# Patient Record
Sex: Male | Born: 1985 | Race: White | Hispanic: No | Smoking: Current every day smoker
Health system: Southern US, Community
[De-identification: ages and names within clinical notes are randomized; demographics above are authoritative.]

## PROBLEM LIST (undated history)

## (undated) DIAGNOSIS — S060XAA Concussion with loss of consciousness status unknown, initial encounter: Secondary | ICD-10-CM

## (undated) DIAGNOSIS — Z87442 Personal history of urinary calculi: Secondary | ICD-10-CM

## (undated) DIAGNOSIS — S060X9A Concussion with loss of consciousness of unspecified duration, initial encounter: Secondary | ICD-10-CM

## (undated) DIAGNOSIS — F319 Bipolar disorder, unspecified: Secondary | ICD-10-CM

## (undated) DIAGNOSIS — T8859XA Other complications of anesthesia, initial encounter: Secondary | ICD-10-CM

## (undated) DIAGNOSIS — F419 Anxiety disorder, unspecified: Secondary | ICD-10-CM

## (undated) DIAGNOSIS — T4145XA Adverse effect of unspecified anesthetic, initial encounter: Secondary | ICD-10-CM

## (undated) DIAGNOSIS — F32A Depression, unspecified: Secondary | ICD-10-CM

## (undated) DIAGNOSIS — J45909 Unspecified asthma, uncomplicated: Secondary | ICD-10-CM

## (undated) DIAGNOSIS — F329 Major depressive disorder, single episode, unspecified: Secondary | ICD-10-CM

## (undated) HISTORY — PX: FOOT SURGERY: SHX648

## (undated) HISTORY — PX: CYSTOSCOPY W/ STONE MANIPULATION: SHX1427

## (undated) HISTORY — PX: CHOLECYSTECTOMY: SHX55

## (undated) HISTORY — PX: LITHOTRIPSY: SUR834

---
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
## 2004-07-14 ENCOUNTER — Emergency Department (HOSPITAL_COMMUNITY): Admission: EM | Admit: 2004-07-14 | Discharge: 2004-07-14 | Payer: Self-pay | Admitting: Family Medicine

## 2005-01-10 ENCOUNTER — Emergency Department (HOSPITAL_COMMUNITY): Admission: EM | Admit: 2005-01-10 | Discharge: 2005-01-10 | Payer: Self-pay | Admitting: Family Medicine

## 2005-09-05 ENCOUNTER — Emergency Department (HOSPITAL_COMMUNITY): Admission: EM | Admit: 2005-09-05 | Discharge: 2005-09-05 | Payer: Self-pay | Admitting: Emergency Medicine

## 2005-09-06 ENCOUNTER — Encounter: Admission: RE | Admit: 2005-09-06 | Discharge: 2005-09-06 | Payer: Self-pay | Admitting: Orthopaedic Surgery

## 2005-09-14 ENCOUNTER — Ambulatory Visit (HOSPITAL_COMMUNITY): Admission: RE | Admit: 2005-09-14 | Discharge: 2005-09-15 | Payer: Self-pay | Admitting: Orthopaedic Surgery

## 2005-11-28 ENCOUNTER — Inpatient Hospital Stay (HOSPITAL_COMMUNITY): Admission: EM | Admit: 2005-11-28 | Discharge: 2005-11-30 | Payer: Self-pay

## 2006-03-07 ENCOUNTER — Emergency Department (HOSPITAL_COMMUNITY): Admission: EM | Admit: 2006-03-07 | Discharge: 2006-03-07 | Payer: Self-pay | Admitting: Emergency Medicine

## 2007-07-17 ENCOUNTER — Emergency Department (HOSPITAL_COMMUNITY): Admission: EM | Admit: 2007-07-17 | Discharge: 2007-07-18 | Payer: Self-pay | Admitting: Emergency Medicine

## 2007-07-17 ENCOUNTER — Emergency Department (HOSPITAL_COMMUNITY): Admission: EM | Admit: 2007-07-17 | Discharge: 2007-07-17 | Payer: Self-pay | Admitting: Emergency Medicine

## 2007-07-18 ENCOUNTER — Inpatient Hospital Stay (HOSPITAL_COMMUNITY): Admission: EM | Admit: 2007-07-18 | Discharge: 2007-07-21 | Payer: Self-pay | Admitting: *Deleted

## 2007-07-21 ENCOUNTER — Ambulatory Visit: Payer: Self-pay | Admitting: *Deleted

## 2007-09-11 ENCOUNTER — Emergency Department (HOSPITAL_COMMUNITY): Admission: EM | Admit: 2007-09-11 | Discharge: 2007-09-11 | Payer: Self-pay | Admitting: Family Medicine

## 2007-09-13 ENCOUNTER — Emergency Department (HOSPITAL_COMMUNITY): Admission: EM | Admit: 2007-09-13 | Discharge: 2007-09-13 | Payer: Self-pay | Admitting: Emergency Medicine

## 2007-11-19 ENCOUNTER — Ambulatory Visit: Payer: Self-pay | Admitting: *Deleted

## 2007-11-19 ENCOUNTER — Emergency Department (HOSPITAL_COMMUNITY): Admission: EM | Admit: 2007-11-19 | Discharge: 2007-11-20 | Payer: Self-pay | Admitting: Emergency Medicine

## 2007-11-20 ENCOUNTER — Inpatient Hospital Stay (HOSPITAL_COMMUNITY): Admission: AD | Admit: 2007-11-20 | Discharge: 2007-11-20 | Payer: Self-pay | Admitting: *Deleted

## 2008-07-11 ENCOUNTER — Emergency Department (HOSPITAL_COMMUNITY): Admission: EM | Admit: 2008-07-11 | Discharge: 2008-07-11 | Payer: Self-pay | Admitting: Emergency Medicine

## 2008-08-04 ENCOUNTER — Emergency Department: Payer: Self-pay | Admitting: Emergency Medicine

## 2009-03-25 ENCOUNTER — Emergency Department: Payer: Self-pay | Admitting: Unknown Physician Specialty

## 2009-03-28 ENCOUNTER — Emergency Department: Payer: Self-pay | Admitting: Internal Medicine

## 2009-04-13 ENCOUNTER — Emergency Department: Payer: Self-pay | Admitting: Emergency Medicine

## 2009-07-05 ENCOUNTER — Emergency Department: Payer: Self-pay | Admitting: Emergency Medicine

## 2010-05-11 LAB — RAPID URINE DRUG SCREEN, HOSP PERFORMED
Amphetamines: NOT DETECTED
Barbiturates: NOT DETECTED
Benzodiazepines: NOT DETECTED
Cocaine: NOT DETECTED
Opiates: POSITIVE — AB
Tetrahydrocannabinol: POSITIVE — AB

## 2010-05-11 LAB — POCT I-STAT, CHEM 8
BUN: 5 mg/dL — ABNORMAL LOW (ref 6–23)
Calcium, Ion: 1.18 mmol/L (ref 1.12–1.32)
Chloride: 100 mEq/L (ref 96–112)
Creatinine, Ser: 1 mg/dL (ref 0.4–1.5)
Glucose, Bld: 102 mg/dL — ABNORMAL HIGH (ref 70–99)
HCT: 47 % (ref 39.0–52.0)
Hemoglobin: 16 g/dL (ref 13.0–17.0)
Potassium: 4.1 mEq/L (ref 3.5–5.1)
Sodium: 141 mEq/L (ref 135–145)
TCO2: 28 mmol/L (ref 0–100)

## 2010-05-11 LAB — ETHANOL: Alcohol, Ethyl (B): <5 mg/dL (ref 0–10)
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
## 2010-06-16 NOTE — Discharge Summary (Signed)
[name], Donovan NO.:  [id number]   MEDICAL RECORD NO.:  [id number]         PATIENT TYPE:  BIPS   LOCATION:                                [facility]ACILITY[facility]  [facility]   PHYSICIAN:  Jasmine Pang, M.D. DATE OF BIRTH:  1985-07-23   DATE OF ADMISSION:  11/19/2007  DATE OF DISCHARGE:  11/20/2007                               DISCHARGE SUMMARY   PSYCHIATRIC ADMISSION ASSESSMENT/DISCHARGE SUMMARY.   IDENTIFYING INFORMATION:  This is a 25 year old single white male who  was admitted on a voluntary basis on November 19, 2007.   HISTORY OF PRESENT ILLNESS:  The patient states he started to relapse,  but I got a grip on it.  He states before got too bad.  He states he  was using 2 to 3 bags of heroin a week.  He initially wanted to come  into the hospital to get away from his negative environment and to work  on rehab.  However, now his family has come from [place] to take  him to another program in [place], [place].  He wants to return  home with them because he feels that will be better to be in a different  setting rather than around all his drug-using friends.  Stressors  include financial and job (he still on [employer] from his job at [employer]).   PAST PSYCHIATRIC HISTORY:  The patient was here in June 2009 for heroin  dependence.  He has not seen anyone outpatient recently, but he is  scheduled to see Dr. Evelene Croon on December 14, 2007.   SOCIAL HISTORY:  The patient was for [employer].  He is currently  on FMLA.  He went to [employer] for 1-1/2 years.  He has a girlfriend.  He has  never been married, but hopes to marry this person.   FAMILY HISTORY:  Positive depression in a lot of family members.  Also,  positive alcohol dependence in family members.   ALCOHOL AND DRUG HISTORY:  One-pack cigarettes per day.  He does not use  alcohol.  There is some marijuana use.  Positive heroin abuse as  indicated above.  No cocaine.   MEDICAL PROBLEMS:  None.   MEDICATIONS:  None.   DRUG ALLERGIES:  No known drug allergies.   PHYSICAL FINDINGS:  There were no acute physical or medical problems  noted.  The patient's physical exam was done in the ED.   Admission laboratories were done in the ED and evaluated by the ED  physician.   HOSPITAL COURSE:  Upon admission, the patient's mental status was  friendly and cooperative.  He was dressed casually.  His speech was  normal rate and flow.  Psychomotor activity was within normal limits.  He had good eye contact.  Mood was depressed and anxious.  Affect  consistent with mood.  There was no suicidal or homicidal ideation.  No  thoughts of self-injurious behavior.  No auditory or visual  hallucinations.  No paranoia or delusions.  Thoughts were logical and  goal-directed, thought content.  No predominant theme.  Cognitive was  intact grossly.  Insight good, judgment good, impulse control good.  The  patient wanted to leave the hospital.  He stated he had only been using  2 to 3 bags per week.  He did not feel like he needed to be on a detox  protocol.  His family had driven from [place] this morning to take  him back home.  There were going to enter him in a program at the  [facility] in [place], [place].  It was felt the patient  was safe to go with his family and was able to be discharged today.   DISCHARGE DIAGNOSES:  Axis I:  Polysubstance dependence.  Axis II:  None.  Axis III:  Healthy.  Axis IV:  Severe (problems with primary support group, occupational  problem, economic problem, other psychosocial problem).  Axis V:  Global assessment of functioning was 55 upon discharge.  GAF  was 45 upon admission.  GAF highest past year was 65-70.   DISCHARGE PLAN:  There was no specific activity level or dietary  restrictions.  The patient will go to rehab program in [place], [place] today.  His family is taking him there now.  He will also see  Dr. Evelene Croon, for  psychiatric followup on December 14, 2007, at 12:45 p.m.   DISCHARGE MEDICATIONS:  None.      Jasmine Pang, M.D.  Electronically Signed     BHS/MEDQ  D:  11/20/2007  T:  11/21/2007  Job:  [id number]
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
## 2010-10-29 LAB — BASIC METABOLIC PANEL
BUN: 9
CO2: 27
Calcium: 9.5
Creatinine, Ser: 0.83
GFR calc Af Amer: >60

## 2010-10-29 LAB — URINE MICROSCOPIC-ADD ON

## 2010-10-29 LAB — URINALYSIS, ROUTINE W REFLEX MICROSCOPIC
Glucose, UA: NEGATIVE
Hgb urine dipstick: NEGATIVE
Leukocytes, UA: NEGATIVE
Nitrite: NEGATIVE
Protein, ur: 30 — AB
Specific Gravity, Urine: >1.046 — ABNORMAL HIGH
Urobilinogen, UA: 1
pH: 6

## 2010-10-29 LAB — DIFFERENTIAL
Basophils Absolute: 0
Basophils Relative: 0
Eosinophils Absolute: 0
Monocytes Relative: 6
Neutro Abs: 7.9 — ABNORMAL HIGH
Neutrophils Relative %: 75

## 2010-10-29 LAB — CBC
MCHC: 34.8
Platelets: 249
RBC: 5.37
WBC: 10.5

## 2010-10-29 LAB — RAPID URINE DRUG SCREEN, HOSP PERFORMED
Amphetamines: NOT DETECTED
Benzodiazepines: POSITIVE — AB
Cocaine: NOT DETECTED

## 2010-10-29 LAB — ACETAMINOPHEN LEVEL: Acetaminophen (Tylenol), Serum: 15.2

## 2010-10-29 LAB — SALICYLATE LEVEL: Salicylate Lvl: <4

## 2010-10-29 LAB — ETHANOL: Alcohol, Ethyl (B): <5

## 2010-11-02 LAB — CBC
HCT: 43.3
Hemoglobin: 14.6
MCHC: 33.7
MCV: 85.8
RBC: 5.05
WBC: 8.8

## 2010-11-02 LAB — DIFFERENTIAL
Basophils Relative: 0
Eosinophils Absolute: 0.2
Lymphs Abs: 2.3
Monocytes Absolute: 0.5
Monocytes Relative: 6
Neutrophils Relative %: 66

## 2010-11-02 LAB — RAPID URINE DRUG SCREEN, HOSP PERFORMED
Barbiturates: NOT DETECTED
Benzodiazepines: NOT DETECTED

## 2010-11-02 LAB — BASIC METABOLIC PANEL
CO2: 27
Chloride: 103
GFR calc Af Amer: >60
Potassium: 4
Sodium: 137

## 2010-11-02 LAB — ETHANOL: Alcohol, Ethyl (B): <5

## 2014-01-26 ENCOUNTER — Encounter (HOSPITAL_COMMUNITY): Payer: Self-pay | Admitting: Family Medicine

## 2014-01-26 ENCOUNTER — Emergency Department (HOSPITAL_COMMUNITY): Payer: BC Managed Care – PPO

## 2014-01-26 ENCOUNTER — Emergency Department (HOSPITAL_COMMUNITY)
Admission: EM | Admit: 2014-01-26 | Discharge: 2014-01-26 | Disposition: A | Discharge status: Home / Self Care | Payer: BC Managed Care – PPO | Attending: Emergency Medicine | Admitting: Emergency Medicine

## 2014-01-26 DIAGNOSIS — M545 Low back pain: Secondary | ICD-10-CM | POA: Diagnosis present

## 2014-01-26 DIAGNOSIS — Z720 Tobacco use: Secondary | ICD-10-CM | POA: Insufficient documentation

## 2014-01-26 DIAGNOSIS — R109 Unspecified abdominal pain: Secondary | ICD-10-CM | POA: Diagnosis not present

## 2014-01-26 DIAGNOSIS — Z9049 Acquired absence of other specified parts of digestive tract: Secondary | ICD-10-CM | POA: Diagnosis not present

## 2014-01-26 DIAGNOSIS — Z79899 Other long term (current) drug therapy: Secondary | ICD-10-CM | POA: Insufficient documentation

## 2014-01-26 DIAGNOSIS — Z791 Long term (current) use of non-steroidal anti-inflammatories (NSAID): Secondary | ICD-10-CM | POA: Diagnosis not present

## 2014-01-26 LAB — URINALYSIS, ROUTINE W REFLEX MICROSCOPIC
Bilirubin Urine: NEGATIVE
Glucose, UA: NEGATIVE mg/dL
Hgb urine dipstick: NEGATIVE
KETONES UR: NEGATIVE mg/dL
LEUKOCYTES UA: NEGATIVE
NITRITE: NEGATIVE
PH: 7 (ref 5.0–8.0)
Protein, ur: NEGATIVE mg/dL
SPECIFIC GRAVITY, URINE: 1.015 (ref 1.005–1.030)
Urobilinogen, UA: 0.2 mg/dL (ref 0.0–1.0)

## 2014-01-26 LAB — CBC WITH DIFFERENTIAL/PLATELET
BASOS ABS: 0 10*3/uL (ref 0.0–0.1)
BASOS PCT: 1 % (ref 0–1)
Eosinophils Absolute: 0.1 10*3/uL (ref 0.0–0.7)
Eosinophils Relative: 1 % (ref 0–5)
HCT: 39.6 % (ref 39.0–52.0)
HEMOGLOBIN: 13.7 g/dL (ref 13.0–17.0)
Lymphocytes Relative: 26 % (ref 12–46)
Lymphs Abs: 1.9 10*3/uL (ref 0.7–4.0)
MCH: 29.7 pg (ref 26.0–34.0)
MCHC: 34.6 g/dL (ref 30.0–36.0)
MCV: 85.7 fL (ref 78.0–100.0)
MONOS PCT: 6 % (ref 3–12)
Monocytes Absolute: 0.4 10*3/uL (ref 0.1–1.0)
NEUTROS ABS: 4.8 10*3/uL (ref 1.7–7.7)
NEUTROS PCT: 66 % (ref 43–77)
PLATELETS: 192 10*3/uL (ref 150–400)
RBC: 4.62 MIL/uL (ref 4.22–5.81)
RDW: 12.9 % (ref 11.5–15.5)
WBC: 7.2 10*3/uL (ref 4.0–10.5)

## 2014-01-26 LAB — COMPREHENSIVE METABOLIC PANEL
ALBUMIN: 3.9 g/dL (ref 3.5–5.2)
ALK PHOS: 67 U/L (ref 39–117)
ALT: 17 U/L (ref 0–53)
AST: 15 U/L (ref 0–37)
Anion gap: 10 (ref 5–15)
BILIRUBIN TOTAL: 0.1 mg/dL — AB (ref 0.3–1.2)
BUN: 12 mg/dL (ref 6–23)
CHLORIDE: 106 meq/L (ref 96–112)
CO2: 23 mmol/L (ref 19–32)
Calcium: 9.1 mg/dL (ref 8.4–10.5)
Creatinine, Ser: 0.79 mg/dL (ref 0.50–1.35)
GFR calc Af Amer: >90 mL/min (ref >90)
GFR calc non Af Amer: >90 mL/min (ref >90)
Glucose, Bld: 100 mg/dL — ABNORMAL HIGH (ref 70–99)
POTASSIUM: 4.2 mmol/L (ref 3.5–5.1)
SODIUM: 139 mmol/L (ref 135–145)
Total Protein: 6.9 g/dL (ref 6.0–8.3)

## 2014-01-26 LAB — LIPASE, BLOOD: Lipase: 32 U/L (ref 11–59)

## 2014-01-26 LAB — LACTIC ACID, PLASMA: Lactic Acid, Venous: 0.9 mmol/L (ref 0.5–2.2)

## 2014-01-26 MED ORDER — OXYCODONE-ACETAMINOPHEN 7.5-325 MG PO TABS: 1.0000 | ORAL_TABLET | ORAL | Status: DC | PRN

## 2014-01-26 MED ORDER — ONDANSETRON HCL 4 MG/2ML IJ SOLN
4.0000 mg | Freq: Once | INTRAMUSCULAR | Status: AC
  Administered 2014-01-26: 4 mg via INTRAVENOUS
  Filled 2014-01-26: qty 2

## 2014-01-26 MED ORDER — KETOROLAC TROMETHAMINE 30 MG/ML IJ SOLN
15.0000 mg | Freq: Once | INTRAMUSCULAR | Status: AC
  Administered 2014-01-26: 15 mg via INTRAVENOUS
  Filled 2014-01-26: qty 1

## 2014-01-26 MED ORDER — OXYCODONE-ACETAMINOPHEN 5-325 MG PO TABS
1.0000 | ORAL_TABLET | Freq: Once | ORAL | Status: AC
  Administered 2014-01-26: 1 via ORAL
  Filled 2014-01-26: qty 1

## 2014-01-26 MED ORDER — CYCLOBENZAPRINE HCL 10 MG PO TABS: 10.0000 mg | ORAL_TABLET | Freq: Two times a day (BID) | ORAL | Status: DC | PRN

## 2014-01-26 MED ORDER — SODIUM CHLORIDE 0.9 % IV SOLN
Freq: Once | INTRAVENOUS | Status: AC
  Administered 2014-01-26: 18:00:00 via INTRAVENOUS

## 2014-01-26 NOTE — ED Notes (Signed)
Pt to CT at this time.

## 2014-01-26 NOTE — Discharge Instructions (Signed)
Back Pain, Adult °Back pain is very common. The pain often gets better over time. The cause of back pain is usually not dangerous. Most people can learn to manage their back pain on their own.  °HOME CARE  °· Stay active. Start with short walks on flat ground if you can. Try to walk farther each day. °· Do not sit, drive, or stand in one place for more than 30 minutes. Do not stay in bed. °· Do not avoid exercise or work. Activity can help your back heal faster. °· Be careful when you bend or lift an object. Bend at your knees, keep the object close to you, and do not twist. °· Sleep on a firm mattress. Lie on your side, and bend your knees. If you lie on your back, put a pillow under your knees. °· Only take medicines as told by your doctor. °· Put ice on the injured area. °¨ Put ice in a plastic bag. °¨ Place a towel between your skin and the bag. °¨ Leave the ice on for 15-20 minutes, 03-04 times a day for the first 2 to 3 days. After that, you can switch between ice and heat packs. °· Ask your doctor about back exercises or massage. °· Avoid feeling anxious or stressed. Find good ways to deal with stress, such as exercise. °GET HELP RIGHT AWAY IF:  °· Your pain does not go away with rest or medicine. °· Your pain does not go away in 1 week. °· You have new problems. °· You do not feel well. °· The pain spreads into your legs. °· You cannot control when you poop (bowel movement) or pee (urinate). °· Your arms or legs feel weak or lose feeling (numbness). °· You feel sick to your stomach (nauseous) or throw up (vomit). °· You have belly (abdominal) pain. °· You feel like you may pass out (faint). °MAKE SURE YOU:  °· Understand these instructions. °· Will watch your condition. °· Will get help right away if you are not doing well or get worse. °Document Released: 07/07/2007 Document Revised: 04/12/2011 Document Reviewed: 05/22/2013 °Exit[employer]® Patient Information ©2015 [employer]. This information is not intended  to replace advice given to you by your health care provider. Make sure you discuss any questions you have with your health care provider. ° °

## 2014-01-26 NOTE — ED Notes (Signed)
Pt c/o pain in left lower back radiating into left testicle off and on for several weeks.  St's pain has been constant for past 3 days.  Pt st's slight relief of pain with Percocet given to triage.

## 2014-01-26 NOTE — ED Notes (Signed)
Pt here with lower left back pain and pain radiating into testicle. sts testicle is not swollen. sts feels heaviness. sts hx of kidney stone.

## 2014-01-26 NOTE — ED Provider Notes (Signed)
CSN: [id number]     Arrival date & time 01/26/14  1314 History   First MD Initiated Contact with Patient 01/26/14 1630     Chief Complaint  Patient presents with  . Back Pain  . Testicle Pain      HPI Pt c/o pain in left lower back radiating into left testicle off and on for several weeks. St's pain has been constant for past 3 days. Pt st's slight relief of pain with Percocet given to triage.   History reviewed. No pertinent past medical history. Past Surgical History  Procedure Laterality Date  . Lithotripsy    . Cholecystectomy     History reviewed. No pertinent family history. History  Substance Use Topics  . Smoking status: Current Every Day Smoker  . Smokeless tobacco: Not on file  . Alcohol Use: No    Review of Systems All other systems reviewed and are negative   Allergies  Review of patient's allergies indicates no known allergies.  Home Medications   Prior to Admission medications   Medication Sig Start Date End Date Taking? Authorizing Provider  cyclobenzaprine (FLEXERIL) 10 MG tablet Take 1 tablet (10 mg total) by mouth 2 (two) times daily as needed for muscle spasms. 01/26/14   Nelia Shiobert L Shanaya Schneck, MD  naproxen sodium (ANAPROX) 220 MG tablet Take 880 mg by mouth 2 (two) times daily with a meal.   Yes Historical Provider, MD  nicotine (NICODERM CQ - DOSED IN MG/24 HOURS) 21 mg/24hr patch Place 21 mg onto the skin daily.   Yes Historical Provider, MD  oxyCODONE-acetaminophen (PERCOCET) 7.5-325 MG per tablet Take 1 tablet by mouth every 4 (four) hours as needed for pain. 01/26/14   Nelia Shiobert L Evelyn Moch, MD  PARoxetine (PAXIL) 20 MG tablet Take 20 mg by mouth daily.   Yes Historical Provider, MD   BP 123/82 mmHg  Pulse 88  Temp(Src) 98.2 F (36.8 C)  Resp 18  SpO2 98% Physical Exam Physical Exam  Nursing note and vitals reviewed. Constitutional: He is oriented to person, place, and time. He appears well-developed and well-nourished. No distress.  HENT:   Head: Normocephalic and atraumatic.  Eyes: Pupils are equal, round, and reactive to light.  Neck: Normal range of motion.  Cardiovascular: Normal rate and intact distal pulses.   Pulmonary/Chest: No respiratory distress.  Abdominal: Normal appearance. He exhibits no distension.  Musculoskeletal: Pain to palpation left paralumbar sacral area.  Some testicular pain left testicle noted.  No definite hernia in the left inguinal area.  No evidence of rash or zoster.   Neurological: He is alert and oriented to person, place, and time. No cranial nerve deficit.  Skin: Skin is warm and dry. No rash noted.  Psychiatric: He has a normal mood and affect. His behavior is normal.   ED Course  Procedures (including critical care time)  Medications  oxyCODONE-acetaminophen (PERCOCET/ROXICET) 5-325 MG per tablet 1 tablet (1 tablet Oral Given 01/26/14 1422)  ketorolac (TORADOL) 30 MG/ML injection 15 mg (15 mg Intravenous Given 01/26/14 1747)  ondansetron (ZOFRAN) injection 4 mg (4 mg Intravenous Given 01/26/14 1750)  0.9 %  sodium chloride infusion ( Intravenous Stopped 01/26/14 2019)    Labs Review Labs Reviewed  COMPREHENSIVE METABOLIC PANEL - Abnormal; Notable for the following:    Glucose, Bld 100 (*)    Total Bilirubin 0.1 (*)    All other components within normal limits  URINALYSIS, ROUTINE W REFLEX MICROSCOPIC  CBC WITH DIFFERENTIAL  LACTIC ACID, PLASMA  LIPASE, BLOOD  Imaging Review [place] Scrotum  01/26/2014   CLINICAL DATA:  28 year old male with pain radiating to the left testicle for several days. No known injury. Initial encounter.  EXAM: SCROTAL ULTRASOUND  DOPPLER ULTRASOUND OF THE TESTICLES  TECHNIQUE: Complete ultrasound examination of the testicles, epididymis, and other scrotal structures was performed. Color and spectral Doppler ultrasound were also utilized to evaluate blood flow to the testicles.  COMPARISON:  Non contrast CT Abdomen and Pelvis 1649 hr the same day, and  earlier.  FINDINGS: Right testicle  Measurements: 4.0 x 2.0 x 4.6 cm. No mass or microlithiasis visualized.  Left testicle  Measurements: 3.1 x 2.1 x 2.8 cm. No mass or microlithiasis visualized.  Right epididymis:  Normal in size and appearance.  Left epididymis:  Normal in size and appearance.  Hydrocele:  None visualized.  Varicocele: Asymmetric venous prominence on the left, but not to the extent compatible with varicocele (veins up to 2-3 mm diameter).  Pulsed Doppler interrogation of both testes demonstrates low resistance arterial and venous waveforms bilaterally. No convincing asymmetric hypervascularity.  IMPRESSION: Negative for testicular torsion, no definite acute inflammation or acute finding identified.   Electronically Signed   By: Augusto GambleLee  Hall M.D.   On: 01/26/2014 19:42   [place] Art/ven Flow Abd Pelv Doppler  01/26/2014   CLINICAL DATA:  28 year old male with pain radiating to the left testicle for several days. No known injury. Initial encounter.  EXAM: SCROTAL ULTRASOUND  DOPPLER ULTRASOUND OF THE TESTICLES  TECHNIQUE: Complete ultrasound examination of the testicles, epididymis, and other scrotal structures was performed. Color and spectral Doppler ultrasound were also utilized to evaluate blood flow to the testicles.  COMPARISON:  Non contrast CT Abdomen and Pelvis 1649 hr the same day, and earlier.  FINDINGS: Right testicle  Measurements: 4.0 x 2.0 x 4.6 cm. No mass or microlithiasis visualized.  Left testicle  Measurements: 3.1 x 2.1 x 2.8 cm. No mass or microlithiasis visualized.  Right epididymis:  Normal in size and appearance.  Left epididymis:  Normal in size and appearance.  Hydrocele:  None visualized.  Varicocele: Asymmetric venous prominence on the left, but not to the extent compatible with varicocele (veins up to 2-3 mm diameter).  Pulsed Doppler interrogation of both testes demonstrates low resistance arterial and venous waveforms bilaterally. No convincing asymmetric  hypervascularity.  IMPRESSION: Negative for testicular torsion, no definite acute inflammation or acute finding identified.   Electronically Signed   By: Augusto GambleLee  Hall M.D.   On: 01/26/2014 19:42   Ct Renal Stone Study  01/26/2014   CLINICAL DATA:  [age] year old male with left flank pain radiating to the left groin and testicle for 2-3 days. Initial encounter.  EXAM: CT ABDOMEN AND PELVIS WITHOUT CONTRAST  TECHNIQUE: Multidetector CT imaging of th dome e abdomen and pelvis was performed following the standard protocol without IV contrast.  COMPARISON:  CT Abdomen and Pelvis 01/17/2012.  FINDINGS: Minor atelectasis, otherwise negative lung bases. No pericardial or pleural effusion.  No osseous abnormality identified.  No pelvic free fluid. Decompressed, negative distal colon. Left colon, transverse colon, right colon and appendix. No dilated small bowel. Negative non contrast stomach, duodenum, liver, spleen, pancreas, and adrenal glands. Gallbladder now surgically absent. No abdominal free fluid.  Negative non contrast right kidney, and ureter.  Mild asymmetric enlargement of the left renal collecting system compared to the right, but this appearance is unchanged from prior studies. No left intra renal calculus. No definite left hydroureter No calculus or obstructing lesion identified along  the course of the left ureter. Left ureterovesical junction appears normal. Unremarkable bladder. Small posterior left pelvis phlebolith is unchanged. No lymphadenopathy identified.  IMPRESSION: 1. Mild asymmetric enlargement of the left renal collecting system is stable since 2013, and no urologic calculus or obstructive in the etiology is identified. 2. Otherwise negative non contrast CT Abdomen and Pelvis.   Electronically Signed   By: Augusto Gamble M.D.   On: 01/26/2014 17:07   After review of labs in imaging studies.  The most likely source of pain is the lumbar area.  Patient has an appointment with his doctor on Tuesday.  I  recommended he call him on Monday and arrange for an outpatient MRI of the lumbosacral spine.  The meantime he is to take Aleve 2 tablets every 8 hours along with the oxycodone and Flexeril.  Patient agreeable and will follow-up as directed.   MDM   Final diagnoses:  Left flank pain        Nelia Shi, MD 01/26/14 2021

## 2014-01-26 NOTE — ED Notes (Signed)
Pt st's no relief from pain med. 

## 2014-01-29 ENCOUNTER — Other Ambulatory Visit (HOSPITAL_COMMUNITY): Payer: Self-pay | Admitting: Family Medicine

## 2014-01-29 ENCOUNTER — Ambulatory Visit (HOSPITAL_COMMUNITY)
Admission: RE | Admit: 2014-01-29 | Discharge: 2014-01-29 | Disposition: A | Discharge status: Home / Self Care | Payer: BC Managed Care – PPO | Source: Ambulatory Visit | Attending: Family Medicine | Admitting: Family Medicine

## 2014-01-29 DIAGNOSIS — G8929 Other chronic pain: Secondary | ICD-10-CM

## 2014-01-29 DIAGNOSIS — M549 Dorsalgia, unspecified: Secondary | ICD-10-CM | POA: Diagnosis present

## 2014-01-29 DIAGNOSIS — M5126 Other intervertebral disc displacement, lumbar region: Secondary | ICD-10-CM | POA: Diagnosis not present

## 2014-01-29 DIAGNOSIS — M5127 Other intervertebral disc displacement, lumbosacral region: Secondary | ICD-10-CM | POA: Insufficient documentation

## 2014-10-03 ENCOUNTER — Encounter (HOSPITAL_COMMUNITY): Payer: Self-pay | Admitting: *Deleted

## 2014-10-03 ENCOUNTER — Emergency Department (HOSPITAL_COMMUNITY)
Admission: EM | Admit: 2014-10-03 | Discharge: 2014-10-03 | Disposition: A | Discharge status: Home / Self Care | Payer: BLUE CROSS/BLUE SHIELD | Attending: Emergency Medicine | Admitting: Emergency Medicine

## 2014-10-03 DIAGNOSIS — Z23 Encounter for immunization: Secondary | ICD-10-CM | POA: Insufficient documentation

## 2014-10-03 DIAGNOSIS — Z791 Long term (current) use of non-steroidal anti-inflammatories (NSAID): Secondary | ICD-10-CM | POA: Diagnosis not present

## 2014-10-03 DIAGNOSIS — Z79899 Other long term (current) drug therapy: Secondary | ICD-10-CM | POA: Insufficient documentation

## 2014-10-03 DIAGNOSIS — L089 Local infection of the skin and subcutaneous tissue, unspecified: Secondary | ICD-10-CM

## 2014-10-03 DIAGNOSIS — F1310 Sedative, hypnotic or anxiolytic abuse, uncomplicated: Secondary | ICD-10-CM | POA: Diagnosis not present

## 2014-10-03 DIAGNOSIS — Z720 Tobacco use: Secondary | ICD-10-CM | POA: Diagnosis not present

## 2014-10-03 DIAGNOSIS — F1210 Cannabis abuse, uncomplicated: Secondary | ICD-10-CM | POA: Insufficient documentation

## 2014-10-03 DIAGNOSIS — Z008 Encounter for other general examination: Secondary | ICD-10-CM | POA: Diagnosis present

## 2014-10-03 DIAGNOSIS — L0889 Other specified local infections of the skin and subcutaneous tissue: Secondary | ICD-10-CM | POA: Diagnosis not present

## 2014-10-03 DIAGNOSIS — F1110 Opioid abuse, uncomplicated: Secondary | ICD-10-CM

## 2014-10-03 DIAGNOSIS — F1410 Cocaine abuse, uncomplicated: Secondary | ICD-10-CM | POA: Diagnosis not present

## 2014-10-03 LAB — COMPREHENSIVE METABOLIC PANEL
ALT: 22 U/L (ref 17–63)
AST: 26 U/L (ref 15–41)
Albumin: 4.4 g/dL (ref 3.5–5.0)
Alkaline Phosphatase: 77 U/L (ref 38–126)
Anion gap: 8 (ref 5–15)
BUN: 13 mg/dL (ref 6–20)
CHLORIDE: 104 mmol/L (ref 101–111)
CO2: 28 mmol/L (ref 22–32)
Calcium: 9.2 mg/dL (ref 8.9–10.3)
Creatinine, Ser: 0.86 mg/dL (ref 0.61–1.24)
GFR calc Af Amer: >60 mL/min (ref >60)
Glucose, Bld: 121 mg/dL — ABNORMAL HIGH (ref 65–99)
POTASSIUM: 3.5 mmol/L (ref 3.5–5.1)
Sodium: 140 mmol/L (ref 135–145)
Total Bilirubin: 0.6 mg/dL (ref 0.3–1.2)
Total Protein: 7.4 g/dL (ref 6.5–8.1)

## 2014-10-03 LAB — RAPID URINE DRUG SCREEN, HOSP PERFORMED
AMPHETAMINES: NOT DETECTED
BENZODIAZEPINES: POSITIVE — AB
Barbiturates: NOT DETECTED
COCAINE: POSITIVE — AB
Opiates: POSITIVE — AB
TETRAHYDROCANNABINOL: POSITIVE — AB

## 2014-10-03 LAB — CBC
HEMATOCRIT: 41.5 % (ref 39.0–52.0)
HEMOGLOBIN: 14.5 g/dL (ref 13.0–17.0)
MCH: 30.9 pg (ref 26.0–34.0)
MCHC: 34.9 g/dL (ref 30.0–36.0)
MCV: 88.3 fL (ref 78.0–100.0)
Platelets: 212 10*3/uL (ref 150–400)
RBC: 4.7 MIL/uL (ref 4.22–5.81)
RDW: 12.6 % (ref 11.5–15.5)
WBC: 7.9 10*3/uL (ref 4.0–10.5)

## 2014-10-03 LAB — ACETAMINOPHEN LEVEL: Acetaminophen (Tylenol), Serum: <10 ug/mL — ABNORMAL LOW (ref 10–30)

## 2014-10-03 LAB — SALICYLATE LEVEL: Salicylate Lvl: <4 mg/dL (ref 2.8–30.0)

## 2014-10-03 LAB — ETHANOL

## 2014-10-03 MED ORDER — TETANUS-DIPHTH-ACELL PERTUSSIS 5-2.5-18.5 LF-MCG/0.5 IM SUSP
0.5000 mL | Freq: Once | INTRAMUSCULAR | Status: AC
  Administered 2014-10-03: 0.5 mL via INTRAMUSCULAR
  Filled 2014-10-03: qty 0.5

## 2014-10-03 MED ORDER — CEPHALEXIN 500 MG PO CAPS: 500.0000 mg | ORAL_CAPSULE | Freq: Four times a day (QID) | ORAL | Status: DC

## 2014-10-03 NOTE — Discharge Instructions (Signed)
Opioid Withdrawal Opioids are a group of narcotic drugs. They include the street drug heroin. They also include pain medicines, such as morphine, hydrocodone, oxycodone, and fentanyl. Opioid withdrawal is a group of characteristic physical and mental signs and symptoms. It typically occurs if you have been using opioids daily for several weeks or longer and stop using or rapidly decrease use. Opioid withdrawal can also occur if you have used opioids daily for a long time and are given a medicine to block the effect.  SIGNS AND SYMPTOMS Opioid withdrawal includes three or more of the following symptoms:   Depressed, anxious, or irritable mood.  Nausea or vomiting.  Muscle aches or spasms.   Watery eyes.   Runny nose.  Emergency Department Resource Guide 1) Find a Doctor and Pay Out of Pocket Although you won't have to find out who is covered by your insurance plan, it is a good idea to ask around and get recommendations. You will then need to call the office and see if the doctor you have chosen will accept you as a new patient and what types of options they offer for patients who are self-pay. Some doctors offer discounts or will set up payment plans for their patients who do not have insurance, but you will need to ask so you aren't surprised when you get to your appointment.  2) Contact Your Local Health Department Not all health departments have doctors that can see patients for sick visits, but many do, so it is worth a call to see if yours does. If you don't know where your local health department is, you can check in your phone book. The CDC also has a tool to help you locate your state's health department, and many state websites also have listings of all of their local health departments.  3) Find a Walk-in Clinic If your illness is not likely to be very severe or complicated, you may want to try a walk in clinic. These are popping up all over the country in pharmacies, drugstores,  and shopping centers. They're usually staffed by nurse practitioners or physician assistants that have been trained to treat common illnesses and complaints. They're usually fairly quick and inexpensive. However, if you have serious medical issues or chronic medical problems, these are probably not your best option.  No Primary Care Doctor: - Call Health Connect at  [phone] - they can help you locate a primary care doctor that  accepts your insurance, provides certain services, etc. - Physician Referral Service- [phone]  Chronic Pain Problems: Organization         Address  Phone   Notes  [facility]  [phone] Patients need to be referred by their primary care doctor.   Medication Assistance: Organization         Address  Phone   Notes  [facility] Medication [facility] [address] [place] [place], [place] [address] [phone] --Must be a resident of [facility] -- Must have NO insurance coverage whatsoever (no Medicaid/ Medicare, etc.) -- The pt. MUST have a primary care doctor that directs their care regularly and follows them in the community   [facility]  [phone]   [employer]  [phone]    Agencies that provide inexpensive medical care: Organization         Address  Phone   Notes  [facility]  [phone]   [facility]    [phone]  [facility] [address] [place], [place] [address] [phone]   [facility] [address] [place], [place] [phone]   Planned Parenthood    ([phone]   [facility]    [phone]   [facility]  [address], [place] Phone:  [phone], Fax:  [phone] Hours of Operation:  9 am - 6 pm, M-F.  Also accepts Medicaid/Medicare and self-pay.  [facility]  [address], [place] Phone: [phone], Fax: [phone]. Hours of Operation:  8:30 am - 5:30 pm, M-F.  Also accepts Medicaid and self-pay.  [facility] [place] [address] [place] Phone: [phone]   [facility] [address] Natasha Bence [place], [place] [phone], Ext. [phone] Mondays & Thursdays: 7-9 AM.  First 15 patients are seen on a first come, first serve basis.    Medicaid-accepting [facility] Providers:  Organization         Address  Phone   Notes  [facility] [address], [place] [phone] Also accepts self-pay patients.  [facility] [address] Laurell Josephs [place], [place]  [phone]   [facility] [address], [place] [phone]   [facility] [address] [place] [phone]   Renaye Rakers [address], [place]   [phone] Only accepts [place] Access [place] patients after they have their name applied to their card.   Self-Pay (no insurance) in [facility]:  Organization         Address  Phone   Notes  Sickle Cell Patients, [facility] Medicine [address] [place], [place] [phone]   [facility] [address] [place], [place] [phone]   [facility] [place]  [address], [place] [phone]   [facility]/Dr. Osei-Bonsu  [address] [place] or [address], [place] [phone] Phone number for both [place] and [place] locations is the same.  [facility] and [facility] [address], [place] [phone]   [facility] [address] [place] or [address] [phone]   [facility] [address] [place] [phone], phone; [phone], fax Sees patients 1st and 3rd Saturday of every month.  Must not qualify for public or  private insurance (i.e. Medicaid, Medicare, [place] Health Choice, Veterans' Benefits)  Household income should be no more than 200% of the poverty level The clinic cannot treat you if you are pregnant or think you are pregnant  Sexually transmitted diseases are not treated at the clinic.    Dental Care: Organization         Address  Phone  Notes  [facility] Department of [facility] [address] [place], [place] [phone] Accepts children up to age 17 who are enrolled in [place] or [place] Health Choice; pregnant women with a Medicaid card; and children who have applied for Medicaid or  Health Choice, but were declined, whose parents can pay a reduced fee at time of service.  [facility] Department of [facility]  [address], [place] [phone] Accepts children up to age 21 who  are enrolled in Medicaid or [place] Health Choice; pregnant women with a Medicaid card; and children who have applied for Medicaid or [place] Health Choice, but were declined, whose parents can pay a reduced fee at time of service.  [facility]  [address] [place], [place] [phone] Patients are seen by appointment only. Walk-ins are not accepted. [facility] will see patients 39 years of age and older. Monday - Tuesday (8am-5pm) Most Wednesdays (8:30-5pm) $30 per visit, cash only  [facility]  [address], [facility] [phone] Patients are seen by appointment only. Walk-ins are not accepted. [facility] will see patients 64 years of age and older. One Wednesday Evening (Monthly: Volunteer Based).  $30 per visit, cash only  [employer]  [phone] for adults; Children under age 65, call Graduate Pediatric Dentistry at [phone]. Children aged 50-14, please call [phone] to request a pediatric application.  Dental services are provided in all areas of dental  care including fillings, crowns and bridges, complete and partial dentures, implants, gum treatment, root canals, and extractions. Preventive care is also provided. Treatment is provided to both adults and children. Patients are selected via a lottery and there is often a waiting list.   [facility] [address], [place]  [phone] [email or web address]   [facility] [address] [place], [place] [phone], Ext. [phone] Second and Fourth Thursday of each month, opens at 6:30 AM; Clinic ends at 9 AM.  Patients are seen on a first-come first-served basis, and a limited number are seen during each clinic.   [facility]  [address] Ether Griffins [place], [place] [phone]   Eligibility Requirements You must have lived in [place], [place], or [place] counties for at least the last three months.   You cannot be eligible for state or federal sponsored [place], including [employer], [place], or [employer].   You generally cannot be eligible for healthcare insurance through your employer.    How to apply: Eligibility screenings are held every Tuesday and Wednesday afternoon from 1:00 pm until 4:00 pm. You do not need an appointment for the interview!  [facility] [address] [place], [place] [phone]   [facility] Health Department  [phone]   [facility] Health Department  [phone]   [facility] Health Department  [phone]    Behavioral Health Resources in the Community: Intensive Outpatient Programs Organization         Address  Phone  Notes  [facility] Services [address], [place], [place] [phone]   [facility] [address] [place], [place] [phone]   [facility] [address] [place], [place]  [phone]   [facility] Mental Health [address],  [place], [place] [phone] or [phone]    Substance Abuse Resources Organization         Address  Phone  Notes  [facility]  [phone]   [facility]  [phone]   [facility]  [phone]   Floydene Flock  [phone]   Residential & Outpatient Substance Abuse Program  [phone]   Psychological Services Organization         Address  Phone  Notes  [facility] Behavioral Health  [phone]   [facility]  [phone]   [facility] Mental Health [address],  [place] [phone] or [phone]    Mobile Crisis Teams Organization         Address  Phone  Notes  [facility], Mobile Crisis Care Unit  [phone]   [facility]  [address] [place], [place] [phone]   Doristine Locks [address] [place] [phone]    Self-Help/Support Groups Organization         Address  Phone             Notes  [facility] [place] - variety of support groups  [phone]- [phone] Call for more information  Narcotics Anonymous (NA), [facility] [address], [employer] [place]  2 meetings at this location   Statistician         Address  Phone  Notes  [facility] [address],    [place]  [phone]   [facility]  [address] [place] [id number], [place], [place] [phone]   [facility] [address] [place], [place] [phone] Admissions: 8am-3pm M-F  [facility] [address],    [place], [place] [phone]   [facility] [address] [place], [place], [place] [phone]   [facility] [address],  [place], [place] [phone]   [facility] - Intensive Outpatient [address] Laurell Josephs [address], [place], [place] [phone]   [facility] [address] [place].,  [place], [place] [phone] or [phone]   [facility]  Services (RT[facility]) [address] [place], [place] [phone] Accepts Medicaid  [facility] [address],  [place] [phone] Substance Abuse/Addiction Treatment   [facility] Organization         Address  Phone  Notes  [facility]  [phone]   Angie Fava, PhD [address] Ervin Knack [place], [place]   [phone] or [phone]   [facility] Behavioral   [address] [place], [place] [phone]   [facility] [address] [place], [place] [phone] Insurance/Medicaid/sponsorship through [facility] and Families [address]                                    [place], [place] [phone] Therapy/tele-psych/case  [facility] [address], [place] [phone]    Dr. Lolly Mustache  [phone]   [facility] [place]  [employer] [facility] Dept. 1) [address], [place] 2) [address], [place] 3)  [address], [place] [phone]  [phone]   [facility] Child Abuse Hotline (619)131-9255 or [phone] (After Hours)        Dilated pupils, sweating, or hairs standing on end.  Diarrhea or intestinal cramping.  Yawning.   Fever.  Increased blood pressure.  Fast pulse.  Restlessness or trouble sleeping. These signs and symptoms occur within several hours of stopping or reducing short-acting opioids, such as heroin. They can occur within 3 days of stopping or reducing long-acting opioids, such as methadone. Withdrawal begins within minutes of receiving a drug that blocks the effects of opioids, such as naltrexone or naloxone. DIAGNOSIS  Opioid use disorder is diagnosed by your health care provider. You will be asked about your symptoms, drug and alcohol use, medical history,  and use of medicines. A physical exam may be done. Lab tests may be ordered. Your health care provider may have you see a  mental health professional.  TREATMENT  The treatment for opioid withdrawal is usually provided by medical doctors with special training in substance use disorders (addiction specialists). The following medicines may be included in treatment:  Opioids given in place of the abused opioid. They turn on opioid receptors in the brain and lessen or prevent withdrawal symptoms. They are gradually decreased (opioid substitution and taper).  Non-opioids that can lessen certain opioid withdrawal symptoms. They may be used alone or with opioid substitution and taper. Successful long-term recovery usually requires medicine, counseling, and group support. HOME CARE INSTRUCTIONS   Take medicines only as directed by your health care provider.  Check with your health care provider before starting new medicines.  Keep all follow-up visits as directed by your health care provider. SEEK MEDICAL CARE IF:  You are not able to take your medicines as directed.  Your symptoms get worse.  You relapse. SEEK IMMEDIATE MEDICAL CARE IF:  You have serious thoughts about hurting yourself or others.  You have a seizure.  You lose consciousness. Document Released: 01/21/2003 Document Revised: 06/04/2013 Document Reviewed: 01/31/2013 [facility] Patient Information 2015 [place], [place]. This information is not intended to replace advice given to you by your health care provider. Make sure you discuss any questions you have with your health care provider.

## 2014-10-03 NOTE — ED Notes (Addendum)
Pt here for medical clearance and opioid detox. Pt states his psychiatrist wanted to make sure he was medically clear before starting him on medication for bipolar depression, OCD and PTSD. 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 Pt states he told his wife 2 months ago and stopped taking percocet for one week before starting back. Pt denies SI/HI

## 2014-10-03 NOTE — ED Provider Notes (Signed)
History  This chart was scribed for non-physician practitioner, Teressa Lower, FNP,working with Alvira Monday, MD, by Karle Plumber, ED Scribe. This patient was seen in room WTR4/WLPT4 and the patient's care was started at 7:14 PM.  Chief Complaint  Patient presents with  . Medical Clearance   The history is provided by the patient and medical records. No language interpreter was used.    HPI Comments:  [name] is a 29 y.o. male who presents to the Emergency Department complaining of needing a medical clearance for opioid detox. He states his psychiatrist, Dr. Yetta Barre, wanted him to be medically cleared before starting detox. Pt states he has contacted several different places for help. 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 He states he became addicted about one year ago when he fractured three vertebrates in his back. He reports he is a recovering heroin addict and used for about three months 6 years ago and was not honest with the neurologist when he hurt his back and that is why he was able to get the Percocet to begin with. He states he stopped taking the Percocet for about 2 months ago for a week but started back. He states he takes 3-5 Percocet 10 mg daily which is down from 8 daily before stopping himself two months ago. Pt reports several sores on the RUE due to picking the areas because of the Percocet. He denies SI, HI, fever, chills, nausea or vomiting. He is unsure of his last tetanus vaccination.  History reviewed. No pertinent past medical history. Past Surgical History  Procedure Laterality Date  . Lithotripsy    . Cholecystectomy     No family history on file. Social History  Substance Use Topics  . Smoking status: Current Every Day Smoker  . Smokeless tobacco: None  . Alcohol Use: No    Review of Systems  Constitutional: Negative for fever and chills.  Gastrointestinal: Negative for nausea and vomiting.  Skin:  Positive for wound.  Psychiatric/Behavioral: Negative for suicidal ideas, behavioral problems and self-injury.  All other systems reviewed and are negative.   Allergies  Review of patient's allergies indicates no known allergies.  Home Medications   Prior to Admission medications   Medication Sig Start Date End Date Taking? Authorizing Provider  cyclobenzaprine (FLEXERIL) 10 MG tablet Take 1 tablet (10 mg total) by mouth 2 (two) times daily as needed for muscle spasms. 01/26/14   Nelva Nay, MD  naproxen sodium (ANAPROX) 220 MG tablet Take 880 mg by mouth 2 (two) times daily with a meal.    Historical Provider, MD  nicotine (NICODERM CQ - DOSED IN MG/24 HOURS) 21 mg/24hr patch Place 21 mg onto the skin daily.    Historical Provider, MD  oxyCODONE-acetaminophen (PERCOCET) 7.5-325 MG per tablet Take 1 tablet by mouth every 4 (four) hours as needed for pain. 01/26/14   Nelva Nay, MD  PARoxetine (PAXIL) 20 MG tablet Take 20 mg by mouth daily.    Historical Provider, MD   Triage Vitals: BP 143/87 mmHg  Pulse 117  Temp(Src) 98.5 F (36.9 C) (Oral)  Resp 20  SpO2 96% Physical Exam  Constitutional: He is oriented to person, place, and time. He appears well-developed and well-nourished.  HENT:  Head: Normocephalic and atraumatic.  Eyes: EOM are normal.  Neck: Normal range of motion.  Cardiovascular: Normal rate.   Pulmonary/Chest: Effort normal and breath sounds normal.  Abdominal: Soft. Bowel sounds are normal. There is no  tenderness.  Musculoskeletal: Normal range of motion.  Neurological: He is alert and oriented to person, place, and time. He exhibits normal muscle tone. Coordination normal.  Skin:  Multiple sores to bilateral forearms  Psychiatric: He has a normal mood and affect. His behavior is normal. He expresses no homicidal and no suicidal ideation.  Nursing note and vitals reviewed.   ED Course  Procedures (including critical care time) DIAGNOSTIC STUDIES: Oxygen  Saturation is 96% on RA, adequate by my interpretation.   COORDINATION OF CARE: 7:23 PM- Will order medical clearance labs and prescribe antibiotics for the arm wounds. Informed pt that we do not provide inpatient opioid detox services and would provide him with outpatient resources. Pt verbalizes understanding and agrees to plan.  Medications  Tdap (BOOSTRIX) injection 0.5 mL (not administered)    Labs Review Labs Reviewed  CBC  COMPREHENSIVE METABOLIC PANEL  ETHANOL  URINE RAPID DRUG SCREEN, HOSP PERFORMED  ACETAMINOPHEN LEVEL  SALICYLATE LEVEL    MDM   Final diagnoses:  Skin infection  Opiate abuse, continuous    Discussed with pt that we don't detox opiates   I personally performed the services described in this documentation, which was scribed in my presence. The recorded information has been reviewed and is accurate.    Teressa Lower, NP 10/03/14 2003  Alvira Monday, MD 10/04/14 1335

## 2014-10-03 NOTE — ED Notes (Signed)
Delay in lab draw, NP in with pt

## 2015-03-25 ENCOUNTER — Encounter: Payer: Self-pay | Admitting: Emergency Medicine

## 2015-03-25 ENCOUNTER — Inpatient Hospital Stay
Admission: EM | Admit: 2015-03-25 | Discharge: 2015-03-31 | DRG: 897 | Disposition: A | Discharge status: Home / Self Care | Payer: No Typology Code available for payment source | Source: Intra-hospital | Attending: Psychiatry | Admitting: Psychiatry

## 2015-03-25 ENCOUNTER — Emergency Department
Admission: EM | Admit: 2015-03-25 | Discharge: 2015-03-25 | Disposition: A | Discharge status: Other Acute Inpatient Hospital | Payer: BLUE CROSS/BLUE SHIELD | Attending: Emergency Medicine | Admitting: Emergency Medicine

## 2015-03-25 DIAGNOSIS — F10239 Alcohol dependence with withdrawal, unspecified: Secondary | ICD-10-CM | POA: Diagnosis present

## 2015-03-25 DIAGNOSIS — F3289 Other specified depressive episodes: Secondary | ICD-10-CM

## 2015-03-25 DIAGNOSIS — G8929 Other chronic pain: Secondary | ICD-10-CM | POA: Diagnosis present

## 2015-03-25 DIAGNOSIS — F32A Depression, unspecified: Secondary | ICD-10-CM

## 2015-03-25 DIAGNOSIS — Z818 Family history of other mental and behavioral disorders: Secondary | ICD-10-CM

## 2015-03-25 DIAGNOSIS — F17210 Nicotine dependence, cigarettes, uncomplicated: Secondary | ICD-10-CM | POA: Diagnosis present

## 2015-03-25 DIAGNOSIS — F17200 Nicotine dependence, unspecified, uncomplicated: Secondary | ICD-10-CM

## 2015-03-25 DIAGNOSIS — F1124 Opioid dependence with opioid-induced mood disorder: Secondary | ICD-10-CM | POA: Diagnosis present

## 2015-03-25 DIAGNOSIS — Z9049 Acquired absence of other specified parts of digestive tract: Secondary | ICD-10-CM | POA: Diagnosis not present

## 2015-03-25 DIAGNOSIS — F1290 Cannabis use, unspecified, uncomplicated: Secondary | ICD-10-CM

## 2015-03-25 DIAGNOSIS — T402X2A Poisoning by other opioids, intentional self-harm, initial encounter: Secondary | ICD-10-CM | POA: Insufficient documentation

## 2015-03-25 DIAGNOSIS — Y998 Other external cause status: Secondary | ICD-10-CM | POA: Insufficient documentation

## 2015-03-25 DIAGNOSIS — F1490 Cocaine use, unspecified, uncomplicated: Secondary | ICD-10-CM

## 2015-03-25 DIAGNOSIS — G4700 Insomnia, unspecified: Secondary | ICD-10-CM | POA: Diagnosis present

## 2015-03-25 DIAGNOSIS — F1123 Opioid dependence with withdrawal: Secondary | ICD-10-CM | POA: Diagnosis present

## 2015-03-25 DIAGNOSIS — M549 Dorsalgia, unspecified: Secondary | ICD-10-CM | POA: Diagnosis present

## 2015-03-25 DIAGNOSIS — T43592A Poisoning by other antipsychotics and neuroleptics, intentional self-harm, initial encounter: Secondary | ICD-10-CM | POA: Insufficient documentation

## 2015-03-25 DIAGNOSIS — F1210 Cannabis abuse, uncomplicated: Secondary | ICD-10-CM | POA: Insufficient documentation

## 2015-03-25 DIAGNOSIS — F1120 Opioid dependence, uncomplicated: Secondary | ICD-10-CM

## 2015-03-25 DIAGNOSIS — F1190 Opioid use, unspecified, uncomplicated: Secondary | ICD-10-CM

## 2015-03-25 DIAGNOSIS — F1410 Cocaine abuse, uncomplicated: Secondary | ICD-10-CM | POA: Insufficient documentation

## 2015-03-25 DIAGNOSIS — T6592XA Toxic effect of unspecified substance, intentional self-harm, initial encounter: Secondary | ICD-10-CM

## 2015-03-25 DIAGNOSIS — R45851 Suicidal ideations: Secondary | ICD-10-CM

## 2015-03-25 DIAGNOSIS — Y9389 Activity, other specified: Secondary | ICD-10-CM | POA: Insufficient documentation

## 2015-03-25 DIAGNOSIS — Z9889 Other specified postprocedural states: Secondary | ICD-10-CM | POA: Diagnosis not present

## 2015-03-25 DIAGNOSIS — F329 Major depressive disorder, single episode, unspecified: Secondary | ICD-10-CM | POA: Insufficient documentation

## 2015-03-25 DIAGNOSIS — Y9289 Other specified places as the place of occurrence of the external cause: Secondary | ICD-10-CM | POA: Insufficient documentation

## 2015-03-25 DIAGNOSIS — F1020 Alcohol dependence, uncomplicated: Secondary | ICD-10-CM

## 2015-03-25 DIAGNOSIS — Z630 Problems in relationship with spouse or partner: Secondary | ICD-10-CM | POA: Diagnosis not present

## 2015-03-25 HISTORY — DX: Depression, unspecified: F32.A

## 2015-03-25 HISTORY — DX: Bipolar disorder, unspecified: F31.9

## 2015-03-25 HISTORY — DX: Anxiety disorder, unspecified: F41.9

## 2015-03-25 HISTORY — DX: Major depressive disorder, single episode, unspecified: F32.9

## 2015-03-25 LAB — URINE DRUG SCREEN, QUALITATIVE (ARMC ONLY)
AMPHETAMINES, UR SCREEN: NOT DETECTED
Barbiturates, Ur Screen: NOT DETECTED
Benzodiazepine, Ur Scrn: NOT DETECTED
COCAINE METABOLITE, UR ~~LOC~~: POSITIVE — AB
Cannabinoid 50 Ng, Ur ~~LOC~~: POSITIVE — AB
MDMA (Ecstasy)Ur Screen: NOT DETECTED
Methadone Scn, Ur: NOT DETECTED
OPIATE, UR SCREEN: POSITIVE — AB
PHENCYCLIDINE (PCP) UR S: NOT DETECTED
Tricyclic, Ur Screen: NOT DETECTED

## 2015-03-25 LAB — COMPREHENSIVE METABOLIC PANEL
ALBUMIN: 4.1 g/dL (ref 3.5–5.0)
ALT: 14 U/L — ABNORMAL LOW (ref 17–63)
ANION GAP: 6 (ref 5–15)
AST: 21 U/L (ref 15–41)
Alkaline Phosphatase: 62 U/L (ref 38–126)
BILIRUBIN TOTAL: 0.4 mg/dL (ref 0.3–1.2)
BUN: 20 mg/dL (ref 6–20)
CALCIUM: 8.8 mg/dL — AB (ref 8.9–10.3)
CO2: 28 mmol/L (ref 22–32)
Chloride: 107 mmol/L (ref 101–111)
Creatinine, Ser: 0.93 mg/dL (ref 0.61–1.24)
GFR calc Af Amer: >60 mL/min (ref >60)
GFR calc non Af Amer: >60 mL/min (ref >60)
GLUCOSE: 104 mg/dL — AB (ref 65–99)
Potassium: 3.6 mmol/L (ref 3.5–5.1)
SODIUM: 141 mmol/L (ref 135–145)
Total Protein: 7.1 g/dL (ref 6.5–8.1)

## 2015-03-25 LAB — CBC
HEMATOCRIT: 40.5 % (ref 40.0–52.0)
HEMOGLOBIN: 13.9 g/dL (ref 13.0–18.0)
MCH: 30.3 pg (ref 26.0–34.0)
MCHC: 34.2 g/dL (ref 32.0–36.0)
MCV: 88.7 fL (ref 80.0–100.0)
Platelets: 230 10*3/uL (ref 150–440)
RBC: 4.57 MIL/uL (ref 4.40–5.90)
RDW: 13.3 % (ref 11.5–14.5)
WBC: 8.4 10*3/uL (ref 3.8–10.6)

## 2015-03-25 LAB — SALICYLATE LEVEL: Salicylate Lvl: <4 mg/dL (ref 2.8–30.0)

## 2015-03-25 LAB — ETHANOL: Alcohol, Ethyl (B): <5 mg/dL (ref <5)

## 2015-03-25 LAB — ACETAMINOPHEN LEVEL

## 2015-03-25 NOTE — ED Notes (Signed)
Patient states he attempted suicide twice this weekend and has an addiction problem with alcohol and prescription pain medications.  Denies current SI/ HI

## 2015-03-25 NOTE — ED Provider Notes (Signed)
[facility] Emergency Department Provider Note  ____________________________________________  Time seen: Approximately 11:35 PM  I have reviewed the triage vital signs and the nursing notes.   HISTORY  Chief Complaint Suicidal    HPI KEYMARION BEARMAN is a 30 y.o. male and a history of depression and polysubstance abuse presenting with suicidal ideations and suicide attempt 2. Patient states that he has had some recent stressors with his pregnant girlfriend and her son. On Friday he took multiple tablets of 30 mg oxycodone and Seroquel with a fifth of vodka, and then he repeated all 3 of these on Saturday night in an attempt to kill himself. He denies any homicidal ideations or hallucinations. He denies any medical complaints including abdominal pain, nausea vomiting or diarrhea, fever, headache.   History reviewed. No pertinent past medical history.  There are no active problems to display for this patient.   Past Surgical History  Procedure Laterality Date  . Lithotripsy    . Cholecystectomy      No current outpatient prescriptions on file.  Allergies Review of patient's allergies indicates no known allergies.  History reviewed. No pertinent family history.  Social History Social History  Substance Use Topics  . Smoking status: Current Every Day Smoker -- 1.00 packs/day    Types: Cigarettes  . Smokeless tobacco: Never Used  . Alcohol Use: Yes     Comment: 6 pack of beer / day    Review of Systems Constitutional: No fever/chills. No syncope. Eyes: No visual changes. ENT: No sore throat. Cardiovascular: Denies chest pain, palpitations. Respiratory: Denies shortness of breath.  No cough. Gastrointestinal: No abdominal pain.  No nausea, no vomiting.  No diarrhea.  No constipation. Genitourinary: Negative for dysuria. Musculoskeletal: Negative for back pain. Skin: Negative for rash. Neurological: Negative for headaches, focal weakness or  numbness. Psychiatric:Positive suicidal ideation with suicide attempts. Negative HI. Negative hallucinations.  10-point ROS otherwise negative.  ____________________________________________   PHYSICAL EXAM:  VITAL SIGNS: ED Triage Vitals  Enc Vitals Group     BP 03/25/15 2035 161/93 mmHg     Pulse Rate 03/25/15 2035 90     Resp 03/25/15 2035 18     Temp 03/25/15 2035 98.2 F (36.8 C)     Temp Source 03/25/15 2035 Oral     SpO2 03/25/15 2035 93 %     Weight 03/25/15 2035 210 lb (95.255 kg)     Height 03/25/15 2035  (1.854 m)     Head Cir --      Peak Flow --      Pain Score 03/25/15 2037 0     Pain Loc --      Pain Edu? --      Excl. in GC? --     Constitutional: Alert and oriented. Well appearing and in no acute distress. Answer question appropriately. Eyes: Conjunctivae are normal.  EOMI. PERRLA. No pinpoint pupils. Head: Atraumatic. Nose: No congestion/rhinnorhea. Mouth/Throat: Mucous membranes are moist.  Neck: No stridor.  Supple.   Cardiovascular: Normal rate, regular rhythm. No murmurs, rubs or gallops.  Respiratory: Normal respiratory effort.  No retractions. Lungs CTAB.  No wheezes, rales or ronchi. Gastrointestinal: Soft and nontender. No distention. No peritoneal signs. Musculoskeletal: No LE edema.  Neurologic:  Normal speech and language. No slurred speech. No gross focal neurologic deficits are appreciated.  Skin:  Skin is warm, dry and intact. No rash noted. Psychiatric: Patient makes poor eye contact during her evaluation. His mood is depressed and  his affect is flat. He continues to endorse suicidal ideations.  ____________________________________________   LABS (all labs ordered are listed, but only abnormal results are displayed)  Labs Reviewed  COMPREHENSIVE METABOLIC PANEL - Abnormal; Notable for the following:    Glucose, Bld 104 (*)    Calcium 8.8 (*)    ALT 14 (*)    All other components within normal limits  ACETAMINOPHEN LEVEL -  Abnormal; Notable for the following:    Acetaminophen (Tylenol), Serum <10 (*)    All other components within normal limits  URINE DRUG SCREEN, QUALITATIVE ([facility] ONLY) - Abnormal; Notable for the following:    Cocaine Metabolite,Ur [place] POSITIVE (*)    Opiate, Ur Screen POSITIVE (*)    Cannabinoid 50 Ng, Ur [place] POSITIVE (*)    All other components within normal limits  ETHANOL  SALICYLATE LEVEL  CBC   ____________________________________________  EKG  Not indicated ____________________________________________  RADIOLOGY  No results found.  ____________________________________________   PROCEDURES  Procedure(s) performed: None  Critical Care performed: No ____________________________________________   INITIAL IMPRESSION / ASSESSMENT AND PLAN / ED COURSE  Pertinent labs & imaging results that were available during my care of the patient were reviewed by me and considered in my medical decision making (see chart for details).  30 y.o. male presenting with suicidal ideations and suicide attempt 23 and 4 days ago. At this time, the patient has no vital sign abnormalities or clinical exam findings consistent with oxycodone or Seroquel overdoses. He has been medically cleared and is admitted for psychiatric evaluation.  ____________________________________________  FINAL CLINICAL IMPRESSION(S) / ED DIAGNOSES  Final diagnoses:  Suicidal ideation  Suicide attempt by substance overdose, initial encounter (HCC)  Depression  Cocaine use  Marijuana use  Opiate use      NEW MEDICATIONS STARTED DURING THIS VISIT:  Discharge Medication List as of 03/25/2015 11:29 PM       Rockne Menghini, MD 03/25/15 2343
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
## 2015-03-25 NOTE — ED Notes (Signed)
Pt presents to ED for help with drugs/alcohol abuse. Pt reports 2 suicide attempts this past weekend by ingesting prescription medications, but denies any SI/HI at this time. Pt is A&O, calm and cooperative.

## 2015-03-26 DIAGNOSIS — F1124 Opioid dependence with opioid-induced mood disorder: Principal | ICD-10-CM

## 2015-03-26 DIAGNOSIS — F3289 Other specified depressive episodes: Secondary | ICD-10-CM

## 2015-03-26 DIAGNOSIS — F1020 Alcohol dependence, uncomplicated: Secondary | ICD-10-CM

## 2015-03-26 DIAGNOSIS — F1120 Opioid dependence, uncomplicated: Secondary | ICD-10-CM

## 2015-03-26 DIAGNOSIS — F17200 Nicotine dependence, unspecified, uncomplicated: Secondary | ICD-10-CM

## 2015-03-26 DIAGNOSIS — F1123 Opioid dependence with withdrawal: Secondary | ICD-10-CM

## 2015-03-26 MED ORDER — MAGNESIUM HYDROXIDE 400 MG/5ML PO SUSP
30.0000 mL | Freq: Every day | ORAL | Status: DC | PRN
Start: 2015-03-26 — End: 2015-03-31

## 2015-03-26 MED ORDER — LOPERAMIDE HCL 2 MG PO CAPS: 4.0000 mg | ORAL_CAPSULE | Freq: Four times a day (QID) | ORAL | Status: DC | PRN

## 2015-03-26 MED ORDER — ALUM & MAG HYDROXIDE-SIMETH 200-200-20 MG/5ML PO SUSP: 30.0000 mL | ORAL | Status: DC | PRN

## 2015-03-26 MED ORDER — ONDANSETRON 4 MG PO TBDP
8.0000 mg | ORAL_TABLET | Freq: Three times a day (TID) | ORAL | Status: DC
  Administered 2015-03-26 – 2015-03-27 (×2): 8 mg via ORAL
  Filled 2015-03-26 (×3): qty 2

## 2015-03-26 MED ORDER — CHLORDIAZEPOXIDE HCL 25 MG PO CAPS
25.0000 mg | ORAL_CAPSULE | Freq: Three times a day (TID) | ORAL | Status: DC
  Administered 2015-03-26 – 2015-03-29 (×10): 25 mg via ORAL
  Filled 2015-03-26 (×12): qty 1

## 2015-03-26 MED ORDER — LOPERAMIDE HCL 2 MG PO CAPS
4.0000 mg | ORAL_CAPSULE | Freq: Three times a day (TID) | ORAL | Status: DC
  Administered 2015-03-26: 4 mg via ORAL
  Filled 2015-03-26 (×2): qty 2

## 2015-03-26 MED ORDER — HYDROXYZINE HCL 50 MG PO TABS
50.0000 mg | ORAL_TABLET | Freq: Four times a day (QID) | ORAL | Status: DC | PRN
  Administered 2015-03-26 – 2015-03-27 (×3): 50 mg via ORAL
  Filled 2015-03-26 (×3): qty 1

## 2015-03-26 MED ORDER — NICOTINE 21 MG/24HR TD PT24
21.0000 mg | MEDICATED_PATCH | Freq: Every day | TRANSDERMAL | Status: DC
  Administered 2015-03-26 – 2015-03-30 (×5): 21 mg via TRANSDERMAL
  Filled 2015-03-26 (×5): qty 1

## 2015-03-26 MED ORDER — ONDANSETRON 4 MG PO TBDP
8.0000 mg | ORAL_TABLET | Freq: Three times a day (TID) | ORAL | Status: DC | PRN
  Administered 2015-03-26: 8 mg via ORAL
  Filled 2015-03-26: qty 2

## 2015-03-26 MED ORDER — IBUPROFEN 600 MG PO TABS
600.0000 mg | ORAL_TABLET | Freq: Four times a day (QID) | ORAL | Status: DC | PRN
  Administered 2015-03-26 (×2): 600 mg via ORAL
  Filled 2015-03-26 (×2): qty 1

## 2015-03-26 MED ORDER — NICOTINE 21 MG/24HR TD PT24: 21.0000 mg | MEDICATED_PATCH | Freq: Every day | TRANSDERMAL | Status: DC | PRN

## 2015-03-26 MED ORDER — ACETAMINOPHEN 325 MG PO TABS
650.0000 mg | ORAL_TABLET | Freq: Four times a day (QID) | ORAL | Status: DC | PRN
  Administered 2015-03-29 – 2015-03-31 (×3): 650 mg via ORAL
  Filled 2015-03-26 (×3): qty 2

## 2015-03-26 NOTE — BHH Counselor (Signed)
Adult Comprehensive Assessment  Patient ID: [name], male   DOB: Aug 21, 1985, 30 y.o.   MRN: [id number]  Information Source: Information source: Patient  Current Stressors:  Educational / Learning stressors: None reported  Employment / Job issues: Pt currently works as a Curator  Family Relationships: Pt was recently seperated from wife. His wife is 3 months pregnant.  Financial / Lack of resources (include bankruptcy): Limited income.  Housing / Lack of housing: Pt reports he does not like where his living.  Physical health (include injuries & life threatening diseases): Pt injuried his back 2 years ago.  Social relationships: None reported  Substance abuse: Pt reports drinking 6 pack of beer, 75-100 mg of Percocet, occasional marijuana and cocaine use.  Bereavement / Loss: Recent seperation from wife.   Living/Environment/Situation:  Living Arrangements: Alone Living conditions (as described by patient or guardian): Pt does not living alone.  How long has patient lived in current situation?: few months  What is atmosphere in current home: Temporary  Family History:  Marital status: Separated Separated, when?: December 2016 What types of issues is patient dealing with in the relationship?: Pt's substance abuse.  Additional relationship information: Wife is 3 months pregnant.  Are you sexually active?: Yes What is your sexual orientation?: Heterosexual  Has your sexual activity been affected by drugs, alcohol, medication, or emotional stress?: None reported  Does patient have children?: No  Childhood History:  By whom was/is the patient raised?: Both parents Description of patient's relationship with caregiver when they were a child: Good relationship with parents  Patient's description of current relationship with people who raised him/her: Good relationship with parents  How were you disciplined when you got in trouble as a child/adolescent?: None reported  Does  patient have siblings?: No Did patient suffer any verbal/emotional/physical/sexual abuse as a child?: No Did patient suffer from severe childhood neglect?: No Has patient ever been sexually abused/assaulted/raped as an adolescent or adult?: No Was the patient ever a victim of a crime or a disaster?: No Witnessed domestic violence?: No Has patient been effected by domestic violence as an adult?: No  Education:  Highest grade of school patient has completed: 12th Currently a student?: No Learning disability?: No  Employment/Work Situation:   Employment situation: Employed 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 2016 Patient's job has been impacted by current illness: No What is the longest time patient has a held a job?: 5 years Where was the patient employed at that time?: Curator  Has patient ever been in the [employer]?: No  Financial Resources:   Surveyor, quantity resources: Income from employment Does patient have a representative payee or guardian?: No  Alcohol/Substance Abuse:   What has been your use of drugs/alcohol within the last 12 months?: Pt reports drinking a 6 pack a day, 75-100 mg of percocet, occasional marijuana and cocaine use.  If attempted suicide, did drugs/alcohol play a role in this?: No Alcohol/Substance Abuse Treatment Hx: Past Tx, Inpatient, Past detox If yes, describe treatment: [facility], [facility], [facility]- 8 years ago.  Has alcohol/substance abuse ever caused legal problems?: No  Social Support System:   Patient's Community Support System: Fair Describe Community Support System: Family Type of faith/religion: Christainity How does patient's faith help to cope with current illness?: "my faith"   Leisure/Recreation:   Leisure and Hobbies: hunting, fishing  Strengths/Needs:   What things does the patient do well?: fixing things  In what  areas does patient struggle / problems for patient: Substance  abuse, depression, relationship issues.   Discharge Plan:   Does patient have access to transportation?: Yes Will patient be returning to same living situation after discharge?: No Plan for living situation after discharge: Pt would like to go to an inpatient substance abuse program.  Currently receiving community mental health services: No If no, would patient like referral for services when discharged?: Yes (What county?) ([place]) Air cabin crewatient have financial barriers related to discharge medications?: No  Summary/Recommendations:    Patient is a 30 year old male admitted  with a diagnosis of Opiate- induced depressive disorder. Patient presented to the hospital with SI, depression, opiate and alcohol abuse. Patient reports primary triggers for admission were back injury and recent relationship problems. Pt reports living alone in [place] prior to admission. He reports attempting suicide via overdose twice. He states he uses 6 pack of beer and 75 - 100 mg of Percocet per day. He also reports occasional cocaine and marijuana use. Pt would like to be referred to inpatient substance abuse treatment. CSW assessing.  Patient will benefit from crisis stabilization, medication evaluation, group therapy and psycho education in addition to case management for discharge. At discharge, it is recommended that patient remain compliant with established discharge plan and continued treatment.   Nancee Brownrigg L Wenzel Backlund.MSW, [facility]  03/26/2015

## 2015-03-26 NOTE — BHH Group Notes (Signed)
BHH Group Notes:  (Nursing/MHT/Case Management/Adjunct)  Date:  03/26/2015  Time:  10:12 PM  Type of Therapy:  Wrap-up Group  Participation Level:  Did Not Attend  Participation Quality:  N/A  Affect:  N/A  Cognitive:  N/A  Insight:  None  Engagement in Group:  N/A  Modes of Intervention:  N/A  Summary of Progress/Problems:  [name] 03/26/2015, 10:12 PM

## 2015-03-26 NOTE — BHH Group Notes (Signed)
BHH Group Notes:  (Nursing/MHT/Case Management/Adjunct)  Date:  03/26/2015  Time:  2:25 PM  Type of Therapy:  Psychoeducational Skills  Participation Level:  Did Not Attend   [name] 03/26/2015, 2:25 PM

## 2015-03-26 NOTE — BHH Group Notes (Signed)
BHH LCSW Group Therapy  03/26/2015 2:44 PM  Type of Therapy:  Group Therapy  Participation Level:  Did Not Attend  Summary of Progress/Problems: Patient was called to group but did not attend.    Lulu Riding, MSW, LCSWA 03/26/2015, 2:44 PM

## 2015-03-26 NOTE — Plan of Care (Signed)
Problem: Alteration in mood Goal: LTG-Patient reports reduction in suicidal thoughts (Patient reports reduction in suicidal thoughts and is able to verbalize a safety plan for whenever patient is feeling suicidal)  Outcome: Progressing Denies suicidal ideations     

## 2015-03-26 NOTE — Progress Notes (Signed)
Recreation Therapy Notes  At approximately 1:10 pm, LRT attempted assessment. Patient sleeping.  Jacquelynn Cree, LRT/CTRS 03/26/2015 1:31 PM

## 2015-03-26 NOTE — Tx Team (Signed)
Initial Interdisciplinary Treatment Plan   PATIENT STRESSORS: Marital or family conflict Substance abuse   PATIENT STRENGTHS: Ability for insight Average or above average intelligence   PROBLEM LIST: Problem List/Patient Goals Date to be addressed Date deferred Reason deferred Estimated date of resolution  depression 03/26/2015     Polysubstance abuse 03/26/2015                                                DISCHARGE CRITERIA:  Motivation to continue treatment in a less acute level of care  PRELIMINARY DISCHARGE PLAN: Attend 12-step recovery group  PATIENT/FAMIILY INVOLVEMENT: This treatment plan has been presented to and reviewed with the patient, [name], and/or family member.  The patient and family have been given the opportunity to ask questions and make suggestions.  Foster Simpson 03/26/2015, 2:16 AM
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
## 2015-03-26 NOTE — Progress Notes (Signed)
Recreation Therapy Notes  Date: 02.22.17 Time: 3:00 pm Location: Community Room   Group Topic: Self-esteem, Coping skills  Goal Area(s) Addresses:  Patient will identify positive traits about self. Patient will identify at least one coping skill.  Behavioral Response: Did not attend  Intervention: All About Me  Activity: Patients were instructed to make an All About Me pamphlet including their life's motto, positive traits about themselves, healthy coping skills, and their support system.  Education: LRT educated patients on ways they can increase their self-esteem.  Education Outcome: Patient did not attend group.  Clinical Observations/Feedback: Patient did not attend group.  Jacquelynn Cree, LRT/CTRS 03/26/2015 4:20 PM

## 2015-03-26 NOTE — Progress Notes (Signed)
D: Remained  Calm after receiving medication A: Encourage patient to let staff know of concerns R :staff continue to monitor

## 2015-03-26 NOTE — [facility] Group Notes (Signed)
[facility] LCSW Aftercare Discharge Planning Group Note   03/26/2015 12:05 PM  Participation Quality:  Patient was called to group but did not attend.    Hans Rusher T, MSW, LCSWA   

## 2015-03-26 NOTE — Progress Notes (Signed)
D:Patient has remained in room  Entire  Shift. Periods of sweatiness  and  Nausea . Stated he vomited  But did not show Clinical research associate , Stated he is withdrawing from drugs No unit participation. Compliant with medications   . No ADL's   Denies suicidal  homicidal ideations  .  No auditory hallucinations  No pain concerns .  A: Encourage patient participation with unit programming . Instruction  Given on  Medication , verbalize understanding. R: Voice no other concerns. Staff continue to monitor

## 2015-03-26 NOTE — [facility] Suicide Risk Assessment (Signed)
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 Opioid use disorder, severe, dependence ([facility]) [F11.20] 03/26/2015  . Alcohol use disorder, severe, dependence ([facility]) [F10.20] 03/26/2015  . Tobacco use disorder [F17.200] 03/26/2015  . 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  World Science writer [facility]). Score between 0-7:  no or low risk or alcohol related problems. Score between 8-15:  moderate risk of alcohol related problems. Score between 16-19:  high risk of alcohol related problems. Score 20 or above:  warrants further diagnostic evaluation for alcohol dependence and treatment.   CLINICAL FACTORS:   Alcohol/Substance Abuse/Dependencies Previous Psychiatric Diagnoses and Treatments     Psychiatric Specialty Exam: ROS   COGNITIVE FEATURES THAT CONTRIBUTE TO RISK:  None    SUICIDE RISK:   Moderate:  Frequent suicidal ideation with limited intensity, and duration, some specificity in terms of plans, no associated intent, good self-control, limited dysphoria/symptomatology, some risk factors present, and identifiable protective factors,  including available and accessible social support.  PLAN OF CARE: admit to [facility]  I certify that inpatient services furnished can reasonably be expected to improve the patient's condition.   Jimmy Footman, MD 03/26/2015, 3:00 PM
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
## 2015-03-27 MED ORDER — TRAZODONE HCL 100 MG PO TABS
100.0000 mg | ORAL_TABLET | Freq: Every day | ORAL | Status: DC
  Administered 2015-03-27 – 2015-03-30 (×4): 100 mg via ORAL
  Filled 2015-03-27 (×4): qty 1

## 2015-03-27 MED ORDER — PROMETHAZINE HCL 25 MG PO TABS
25.0000 mg | ORAL_TABLET | Freq: Three times a day (TID) | ORAL | Status: DC
  Administered 2015-03-27 – 2015-03-28 (×4): 25 mg via ORAL
  Filled 2015-03-27 (×7): qty 1

## 2015-03-27 MED ORDER — IBUPROFEN 800 MG PO TABS
800.0000 mg | ORAL_TABLET | Freq: Three times a day (TID) | ORAL | Status: DC
  Administered 2015-03-27 – 2015-03-30 (×7): 800 mg via ORAL
  Filled 2015-03-27 (×9): qty 1

## 2015-03-27 MED ORDER — PROMETHAZINE HCL 25 MG PO TABS
25.0000 mg | ORAL_TABLET | Freq: Once | ORAL | Status: AC
  Administered 2015-03-27: 25 mg via ORAL
  Filled 2015-03-27: qty 1

## 2015-03-27 MED ORDER — HYDROXYZINE HCL 50 MG PO TABS
50.0000 mg | ORAL_TABLET | Freq: Three times a day (TID) | ORAL | Status: DC
  Administered 2015-03-27 – 2015-03-31 (×11): 50 mg via ORAL
  Filled 2015-03-27 (×12): qty 1

## 2015-03-27 NOTE — Progress Notes (Signed)
Med seeking Vistaril 50 mg PO PRN given this morning for anxiety

## 2015-03-27 NOTE — Progress Notes (Signed)
Recreation Therapy Notes  Date: 02.23.17 Time: 3:00 pm Location: Community Room  Group Topic: Leisure Education  Goal Area(s) Addresses:  Patient will identify activities for each letter of the alphabet. Patient will verbalize ability to integrate positive leisure into life post d/c. Patient will verbalize ability to use leisure as a Associate Professor.  Behavioral Response: Did not attend  Intervention: Leisure Alphabet  Activity: Patients were given a Leisure Alphabet and instructed to think of a healthy leisure activity for each letter of the alphabet.  Education: LRT educated patients on what they need to participate in leisure.  Education Outcome: Patient did not attend group.  Clinical Observations/Feedback: Patient did not attend group.  Jacquelynn Cree, LRT/CTRS 03/27/2015 4:27 PM

## 2015-03-27 NOTE — Plan of Care (Signed)
Problem: Alteration in mood & ability to function due to Goal: STG: Patient verbalizes decreases in signs of withdrawal Outcome: Not Progressing Verbalizes that he is experiencing nausea.   Slight tremors noted in his hands.  Complains of being tired.

## 2015-03-27 NOTE — Plan of Care (Signed)
Problem: Alteration in mood & ability to function due to Goal: STG-Patient will report withdrawal symptoms Outcome: Progressing Continuity of care maintained by same nurse assignments, patient maintain calmness instantly once the medications requested or asked for is given, symptoms of withdrawals or demeanors are instantly resolved (1st passed effects questionable), Gatorade observed in room. Will continue with care plan and provide timely feedback to physician.

## 2015-03-27 NOTE — Progress Notes (Signed)
Patient ID: [name], male   DOB: 06-20-1985, 30 y.o.   MRN: [id number]  Pt was placed on wait list for [facility].   Daisy Floro Kely Dohn MSW, LCSWA  03/27/2015 9:20 AM

## 2015-03-27 NOTE — Progress Notes (Signed)
[facility] MD Progress Note  03/27/2015 10:47 AM [name]  MRN:  [id number] Subjective:  Pt feels sick, nauseated, weak.  He was unable to sleep last night. He has been trembling and can't get comfortable.  Denies suicidality, homicidality or having auditory or visual hallucinations. He denies any feelings of hopelessness, helplessness or worthlessness. Denies SI or HI. He denies side effects from medications. He denies having any physical complaints. He has been compliant with medications. He has not been attending programming as he is having significant withdrawals. The patient has not displayed unsafe behaviors or disruptive behaviors while in the unit.  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 Opioid use disorder, severe, dependence ([place]) [F11.20] 03/26/2015  . Alcohol use disorder, severe, dependence ([place]) [F10.20] 03/26/2015  . Tobacco use disorder [F17.200] 03/26/2015  . 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 Depression   . Bipolar disorder ([place])   . Anxiety     Past Surgical History  Procedure Laterality Date  . Lithotripsy    . Cholecystectomy     Family History: History reviewed. No pertinent family history.  Social History:  History  Alcohol Use  . 3.6 oz/week  . 6 Cans of beer per week    Comment: 6 pack of beer / day; pint whiskey     History  Drug Use  . Yes  . Special: Marijuana, Cocaine    Comment: oxycodone    Social History   Social History  . Marital Status: Married    Spouse Name: N/A  . Number of Children: N/A  . Years of  Education: N/A   Social History Main Topics  . Smoking status: Current Every Day Smoker -- 1.50 packs/day for 10 years    Types: Cigarettes  . Smokeless tobacco: Never Used  . Alcohol Use: 3.6 oz/week    6 Cans of beer per week     Comment: 6 pack of beer / day; pint whiskey  . Drug Use: Yes    Special: Marijuana, Cocaine     Comment: oxycodone  . Sexual Activity: Not Asked   Other Topics Concern  . None   Social History Narrative    Current Medications: Current Facility-Administered Medications  Medication Dose Route Frequency Provider Last Rate Last Dose  . acetaminophen (TYLENOL) tablet 650 mg  650 mg Oral Q6H PRN Hildred Priest, MD      . alum & mag hydroxide-simeth (MAALOX/MYLANTA) 200-200-20 MG/5ML suspension 30 mL  30 mL Oral Q4H PRN Hildred Priest, MD      . chlordiazePOXIDE (LIBRIUM) capsule 25 mg  25 mg Oral TID Hildred Priest, MD   25 mg at 03/27/15 7628  . hydrOXYzine (ATARAX/VISTARIL) tablet 50 mg  50 mg Oral TID Hildred Priest, MD      . ibuprofen (ADVIL,MOTRIN) tablet 800 mg  800 mg Oral TID AC Hildred Priest, MD      . loperamide (IMODIUM) capsule 4 mg  4 mg Oral TID AC Hildred Priest, MD   4 mg at 03/26/15 1607  . magnesium hydroxide (MILK OF MAGNESIA) suspension 30 mL  30 mL Oral Daily PRN Seth Bake  Hernandez-Gonzalez, MD      . nicotine (NICODERM CQ - dosed in mg/24 hours) patch 21 mg  21 mg Transdermal Daily Hildred Priest, MD   21 mg at 03/27/15 0911  . promethazine (PHENERGAN) tablet 25 mg  25 mg Oral TID AC Hildred Priest, MD      . promethazine (PHENERGAN) tablet 25 mg  25 mg Oral Once Hildred Priest, MD        Lab Results:  Results for orders placed or performed during the hospital encounter of 03/25/15 (from the past 48 hour(s))  Comprehensive metabolic panel     Status: Abnormal   Collection Time: 03/25/15  9:09 PM  Result Value Ref Range   Sodium 141 135  - 145 mmol/L   Potassium 3.6 3.5 - 5.1 mmol/L   Chloride 107 101 - 111 mmol/L   CO2 28 22 - 32 mmol/L   Glucose, Bld 104 (H) 65 - 99 mg/dL   BUN 20 6 - 20 mg/dL   Creatinine, Ser 0.93 0.61 - 1.24 mg/dL   Calcium 8.8 (L) 8.9 - 10.3 mg/dL   Total Protein 7.1 6.5 - 8.1 g/dL   Albumin 4.1 3.5 - 5.0 g/dL   AST 21 15 - 41 U/L   ALT 14 (L) 17 - 63 U/L   Alkaline Phosphatase 62 38 - 126 U/L   Total Bilirubin 0.4 0.3 - 1.2 mg/dL   GFR calc non Af Amer >60 >60 mL/min   GFR calc Af Amer >60 >60 mL/min    Comment: (NOTE) The eGFR has been calculated using the CKD EPI equation. This calculation has not been validated in all clinical situations. eGFR's persistently <60 mL/min signify possible Chronic Kidney Disease.    Anion gap 6 5 - 15  Ethanol (ETOH)     Status: None   Collection Time: 03/25/15  9:09 PM  Result Value Ref Range   Alcohol, Ethyl (B) <5 <5 mg/dL    Comment:        LOWEST DETECTABLE LIMIT FOR SERUM ALCOHOL IS 5 mg/dL FOR MEDICAL PURPOSES ONLY   Salicylate level     Status: None   Collection Time: 03/25/15  9:09 PM  Result Value Ref Range   Salicylate Lvl <3.9 2.8 - 30.0 mg/dL  Acetaminophen level     Status: Abnormal   Collection Time: 03/25/15  9:09 PM  Result Value Ref Range   Acetaminophen (Tylenol), Serum <10 (L) 10 - 30 ug/mL    Comment:        THERAPEUTIC CONCENTRATIONS VARY SIGNIFICANTLY. A RANGE OF 10-30 ug/mL MAY BE AN EFFECTIVE CONCENTRATION FOR MANY PATIENTS. HOWEVER, SOME ARE BEST TREATED AT CONCENTRATIONS OUTSIDE THIS RANGE. ACETAMINOPHEN CONCENTRATIONS >150 ug/mL AT 4 HOURS AFTER INGESTION AND >50 ug/mL AT 12 HOURS AFTER INGESTION ARE OFTEN ASSOCIATED WITH TOXIC REACTIONS.   CBC     Status: None   Collection Time: 03/25/15  9:09 PM  Result Value Ref Range   WBC 8.4 3.8 - 10.6 K/uL   RBC 4.57 4.40 - 5.90 MIL/uL   Hemoglobin 13.9 13.0 - 18.0 g/dL   HCT 40.5 40.0 - 52.0 %   MCV 88.7 80.0 - 100.0 fL   MCH 30.3 26.0 - 34.0 pg   MCHC 34.2 32.0  - 36.0 g/dL   RDW 13.3 11.5 - 14.5 %   Platelets 230 150 - 440 K/uL  Urine Drug Screen, Qualitative ([place] only)     Status: Abnormal   Collection Time: 03/25/15  9:09 PM  Result  Value Ref Range   Tricyclic, Ur Screen NONE DETECTED NONE DETECTED   Amphetamines, Ur Screen NONE DETECTED NONE DETECTED   MDMA (Ecstasy)Ur Screen NONE DETECTED NONE DETECTED   Cocaine Metabolite,Ur [place] POSITIVE (A) NONE DETECTED   Opiate, Ur Screen POSITIVE (A) NONE DETECTED   Phencyclidine (PCP) Ur S NONE DETECTED NONE DETECTED   Cannabinoid 50 Ng, Ur [place] POSITIVE (A) NONE DETECTED   Barbiturates, Ur Screen NONE DETECTED NONE DETECTED   Benzodiazepine, Ur Scrn NONE DETECTED NONE DETECTED   Methadone Scn, Ur NONE DETECTED NONE DETECTED    Comment: (NOTE) 726  Tricyclics, urine               Cutoff 1000 ng/mL 200  Amphetamines, urine             Cutoff 1000 ng/mL 300  MDMA (Ecstasy), urine           Cutoff 500 ng/mL 400  Cocaine Metabolite, urine       Cutoff 300 ng/mL 500  Opiate, urine                   Cutoff 300 ng/mL 600  Phencyclidine (PCP), urine      Cutoff 25 ng/mL 700  Cannabinoid, urine              Cutoff 50 ng/mL 800  Barbiturates, urine             Cutoff 200 ng/mL 900  Benzodiazepine, urine           Cutoff 200 ng/mL 1000 Methadone, urine                Cutoff 300 ng/mL 1100 1200 The urine drug screen provides only a preliminary, unconfirmed 1300 analytical test result and should not be used for non-medical 1400 purposes. Clinical consideration and professional judgment should 1500 be applied to any positive drug screen result due to possible 1600 interfering substances. A more specific alternate chemical method 1700 must be used in order to obtain a confirmed analytical result.  1800 Gas chromato graphy / mass spectrometry (GC/MS) is the preferred 1900 confirmatory method.     Blood Alcohol level:  Lab Results  Component Value Date   ETH <5 03/25/2015   ETH <5 10/03/2014     Physical Findings: AIMS: Facial and Oral Movements Muscles of Facial Expression: None, normal Lips and Perioral Area: None, normal Jaw: None, normal Tongue: None, normal,Extremity Movements Upper (arms, wrists, hands, fingers): None, normal Lower (legs, knees, ankles, toes): None, normal, Trunk Movements Neck, shoulders, hips: None, normal, Overall Severity Severity of abnormal movements (highest score from questions above): None, normal Incapacitation due to abnormal movements: None, normal Patient's awareness of abnormal movements (rate only patient's report): No Awareness, Dental Status Current problems with teeth and/or dentures?: No Does patient usually wear dentures?: No  CIWA:  CIWA-Ar Total: 2 COWS:  COWS Total Score: 4  Musculoskeletal: Strength & Muscle Tone: within normal limits Gait & Station: normal Patient leans: N/A  Psychiatric Specialty Exam: Review of Systems  HENT: Negative.   Eyes: Negative.   Respiratory: Negative.   Cardiovascular: Negative.   Gastrointestinal: Positive for nausea.  Musculoskeletal: Negative.   Skin: Negative.   Neurological: Positive for weakness.  Endo/Heme/Allergies: Negative.   Psychiatric/Behavioral: Positive for depression and substance abuse. Negative for suicidal ideas.    Blood pressure 144/79, pulse 67, temperature 98 F (36.7 C), temperature source Oral, resp. rate 20, height '6\' 1"'  (1.854 m), weight 95.255 kg (210  lb), SpO2 100 %.Body mass index is 27.71 kg/(m^2).  General Appearance: Fairly Groomed  Engineer, water::  Good  Speech:  Clear and Coherent  Volume:  Normal  Mood:  Dysphoric  Affect:  Constricted  Thought Process:  Linear  Orientation:  Full (Time, Place, and Person)  Thought Content:  Hallucinations: None  Suicidal Thoughts:  No  Homicidal Thoughts:  No  Memory:  Immediate;   Good Recent;   Good Remote;   Good  Judgement:  Fair  Insight:  Fair  Psychomotor Activity:  Decreased  Concentration:  Good   Recall:  Good  Fund of Knowledge:Good  Language: Good  Akathisia:  No  Handed:    AIMS (if indicated):     Assets:  Communication Skills  ADL's:  Intact  Cognition: WNL  Sleep:  Number of Hours: 8.3   Treatment Plan Summary:  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 At this time he does not appear to be meeting criteria for a major depressive disorder or bipolar disorder.    Insomnia the patient will receive trazodone 100 mg by mouth daily at bedtime  Opiate withdrawal: Patient will be treated symptomatically with NSAIDs, Imodium, phernergan, Flexeril, Vistaril.  Alcohol withdrawal: Patient has been is started on Librium 25 mg by mouth 3 times a day  Tobacco use disorder: Patient has been is started on a nicotine patch 21 mg a day  Precautions every 15 minute checks  Hospitalization status voluntary  Disposition: referral made to [place] on 2/22----pending admission  Hildred Priest, MD 03/27/2015, 10:47 AM

## 2015-03-27 NOTE — [facility] Group Notes (Signed)
[facility] LCSW Group Therapy  03/27/2015 4:07 PM  Type of Therapy:  Group Therapy  Participation Level:  Did Not Attend  Summary of Progress/Problems: Patient was called to group but did not attend.  Lulu Riding, MSW, LCSWA 03/27/2015, 4:07 PM

## 2015-03-27 NOTE — Tx Team (Signed)
Interdisciplinary Treatment Plan Update (Adult)  Date:  03/27/2015 Time Reviewed:  4:56 PM  Progress in Treatment: Attending groups: No. Participating in groups:  No. Taking medication as prescribed:  Yes. Tolerating medication:  Yes. Family/Significant othe contact made:  No, will contact:  mother Patient understands diagnosis:  Yes. Discussing patient identified problems/goals with staff:  Yes. Medical problems stabilized or resolved:  Yes. Denies suicidal/homicidal ideation: Yes. Issues/concerns per patient self-inventory:  Yes. Other:  New problem(s) identified: No, Describe:  NA  Discharge Plan or Barriers: Pt is on wait list for [facility]   Reason for Continuation of Hospitalization: Anxiety Depression Medication stabilization Suicidal ideation Withdrawal symptoms  Comments:Pt feels sick, nauseated, weak. He was unable to sleep last night. He has been trembling and can't get comfortable. Denies suicidality, homicidality or having auditory or visual hallucinations. He denies any feelings of hopelessness, helplessness or worthlessness. Denies SI or HI. He denies side effects from medications. He denies having any physical complaints. He has been compliant with medications. He has not been attending programming as he is having significant withdrawals. The patient has not displayed unsafe behaviors or disruptive behaviors while in the unit.  Estimated length of stay: 3-5 days   New goal(s): NA  Review of initial/current patient goals per problem list:   1.  Goal(s): Patient will participate in aftercare plan * Met:  * Target date: at discharge * As evidenced by: Patient will participate within aftercare plan AEB aftercare provider and housing plan at discharge being identified.   2.  Goal (s): Patient will exhibit decreased depressive symptoms and suicidal ideations. * Met:  *  Target date: at discharge * As evidenced by: Patient will utilize self rating of depression at 3  or below and demonstrate decreased signs of depression or be deemed stable for discharge by MD.   3.  Goal(s): Patient will demonstrate decreased signs and symptoms of anxiety. * Met:  * Target date: at discharge * As evidenced by: Patient will utilize self rating of anxiety at 3 or below and demonstrated decreased signs of anxiety, or be deemed stable for discharge by MD   4.  Goal(s): Patient will demonstrate decreased signs of withdrawal due to substance abuse * Met:  * Target date: at discharge * As evidenced by: Patient will produce a CIWA/COWS score of 0, have stable vitals signs, and no symptoms of withdrawal.   Attendees: Patient:  [name] 2/23/20174:56 PM  Family:   2/23/20174:56 PM  Physician:  Dr. Jerilee Hoh   2/23/20174:56 PM  Nursing:   Silva Bandy, RN  2/23/20174:56 PM  Case Manager:   2/23/20174:56 PM  Counselor:   2/23/20174:56 PM  Other:  Wray Kearns, LCSWA 2/23/20174:56 PM  Other:  Everitt Amber, LRT  2/23/20174:56 PM  Other:   2/23/20174:56 PM  Other:  2/23/20174:56 PM  Other:  2/23/20174:56 PM  Other:  2/23/20174:56 PM  Other:  2/23/20174:56 PM  Other:  2/23/20174:56 PM  Other:  2/23/20174:56 PM  Other:   2/23/20174:56 PM   Scribe for Treatment Team:   Campbell Stall Shamanda Len,MSW, [place]  03/27/2015, 4:56 PM

## 2015-03-27 NOTE — Progress Notes (Signed)
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)  Coping Skills:   Isolate, Arguments, Substance Abuse, Avoidance, Music, Sports  Personal Challenges: Anger, Communication, Concentration, Decision-Making, Expressing Yourself, Relationships, Self-Esteem/Confidence, Stress Management, Substance Abuse  Leisure Interests (2+):  [employer], Individual - Other (Comment) (Spend time with wife)  Awareness of Community Resources:  Yes  Community Resources:  [employer], Other (Comment) [place])  Current Use: Yes  If no, Barriers?:    Patient Strengths:  "Not right now"  Patient Identified Areas of Improvement:  "I lie a lot"  Current Recreation Participation:  Nothing  Patient Goal for Hospitalization:  To get better  [place] of Residence:  [place] of Residence:  [place]   Current SI (including self-harm):  No  Current HI:  No  Consent to Intern Participation: N/A   Jacquelynn Cree, LRT/CTRS 03/27/2015, 1:06 PM

## 2015-03-27 NOTE — Plan of Care (Signed)
Problem: Diagnosis: Increased Risk For Suicide Attempt Goal: STG-Patient Will Report Suicidal Feelings to Staff Outcome: Progressing 15 minute checks maintained for safety, clinical and moral support provided, patient encouraged to continue to express feelings and demonstrate safe care. Patient remain free from harm, will continue to monitor.

## 2015-03-27 NOTE — [facility] Group Notes (Signed)
[facility] Group Notes:  (Nursing/MHT/Case Management/Adjunct)  Date:  03/27/2015  Time:  2:07 PM  Type of Therapy:  Movement Therapy  Participation Level:  Did Not Attend  [name] [name] [name] 03/27/2015, 2:07 PM

## 2015-03-27 NOTE — Progress Notes (Signed)
Washed up, changed into a clean set of clothes, "call me Scott..", polite, courteous, sad and distant at times.

## 2015-03-27 NOTE — Progress Notes (Signed)
D: Denies SI/HI/AVH.  Complains of anxiety. Verbalizes that he is nauseas, aching, anxious and has the shakes.  States that he is just tired.  Up for meals with encouragement and to receive medications. Has remained in his room in the bed sleeping.  No group attendance. A:  Support and encouragement offered.  Encouraged to drink plenty of fluids to help with the detox process.  Safety maintained.  R:  Safety maintained.  Medication compliant.

## 2015-03-28 MED ORDER — TRAZODONE HCL 150 MG PO TABS: 150.0000 mg | ORAL_TABLET | Freq: Every evening | ORAL | Status: DC | PRN

## 2015-03-28 MED ORDER — CYCLOBENZAPRINE HCL 10 MG PO TABS: 10.0000 mg | ORAL_TABLET | Freq: Three times a day (TID) | ORAL | Status: DC | PRN

## 2015-03-28 NOTE — Plan of Care (Signed)
Problem: [facility] Participation in Recreation Therapeutic Interventions Goal: STG-Patient will demonstrate improved self esteem by identif STG: Self-Esteem - Within 4 treatment sessions, patient will verbalize at least 5 positive affirmation statements in each of 2 treatment sessions to increase self-esteem post d/c.  Outcome: Progressing Treatment Session 1; Completed 1 out of 2: At approximately 11:50 am, LRT met with patient in consultation room. Patient verbalized 5 positive affirmation statements. Patient reported it felt "alright". LRT encouraged patient to continue saying positive affirmation statements. Intervention Used: I Am statements  Leonette Monarch, LRT/CTRS 02.24.17 12:35 pm Goal: STG-Patient will identify at least five coping skills for ** STG: Coping Skills - Within 4 treatment sessions, patient will verbalize at least 5 coping skills for substance abuse in each of 2 treatment sessions to decrease substance abuse post d/c.  Outcome: Progressing Treatment Session 1; Completed 1 out of 2: At approximately 11:50 am, LRT met with patient in consultation room. Patient verbalized 5 coping skills for substance abuse. LRT educated patient on leisure and why it is important to implement it into his schedule. LRT educated and provided patient with blank schedules to help him plan his day and try to avoid using substances. LRT educated patient on healthy support systems. Intervention Used: Coping Skills worksheet  Leonette Monarch, LRT/CTRS 02.24.17 12:38 pm

## 2015-03-28 NOTE — [facility] Group Notes (Signed)
[facility] Group Notes:  (Nursing/MHT/Case Management/Adjunct)  Date:  03/28/2015  Time:  1:53 AM  Type of Therapy:  Psychoeducational Skills  Participation Level:  Did Not Attend  Summary of Progress/Problems:  Foy [place] 03/28/2015, 1:53 AM

## 2015-03-28 NOTE — [facility] Group Notes (Signed)
[facility] Group Notes:  (Nursing/MHT/Case Management/Adjunct)  Date:  03/28/2015  Time:  11:56 AM  Type of Therapy:  Psychoeducational Skills  Participation Level:  Active  Participation Quality:  Appropriate  Affect:  Appropriate  Cognitive:  Appropriate  Insight:  Appropriate  Engagement in Group:  Engaged  Modes of Intervention:  Socialization  Summary of Progress/Problems:  [name] 03/28/2015, 11:56 AM

## 2015-03-28 NOTE — Plan of Care (Signed)
Problem: Alteration in mood & ability to function due to Goal: STG-Patient will comply with prescribed medication regimen (Patient will comply with prescribed medication regimen)  Outcome: Progressing Patient is compliant with ordered medications currently

## 2015-03-28 NOTE — Progress Notes (Signed)
D:  Patient is alert and oriented on the unit this shift.  Patient did not attend groups today.  Patient denies suicidal ideation, homicidal ideation, auditory or visual hallucinations at the present time.   A:  Scheduled medications are administered to patient as per MD orders.  Emotional support and encouragement are provided.  Patient is maintained on q.15 minute safety checks.  Patient is informed to notify staff with questions or concerns. R:  No adverse medication reactions are noted.  Patient is cooperative with medication administration and treatment plan today.  Patient is receptive, calm and cooperative on the unit at this time.  Patient isolates to his room for the majority of the day.  Patient contracts for safety at this time.  Patient remains safe at this time.

## 2015-03-28 NOTE — Plan of Care (Signed)
Problem: Alteration in mood Goal: LTG-Pt's behavior demonstrates decreased signs of depression (Patient's behavior demonstrates decreased signs of depression to the point the patient is safe to return home and continue treatment in an outpatient setting)  Outcome: Progressing Patient states he feels better today than he did yesterday

## 2015-03-28 NOTE — Progress Notes (Signed)
[facility] MD Progress Note  03/28/2015 9:24 AM [name]  MRN:  [id number] Subjective:  Patient feels much improved today. He has been able to eat, denies nausea and vomiting and diarrhea. He was able to sleep all through the night. His mood is much improved. He is no longer having thoughts of suicide.  He plans to start going to groups today. He is is still requesting to go to rehabilitation for substance abuse. Feels guilty about his substance use appears motivated for treatment. Wants to fix the relationship with his wife.  Denies  HI. He denies side effects from medications. He denies having any physical complaints. He has been compliant with medications. The patient has not displayed unsafe behaviors or disruptive behaviors while in the unit.  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 Opioid use disorder, severe, dependence (HCC) [F11.20] 03/26/2015  . Alcohol use disorder, severe, dependence (HCC) [F10.20] 03/26/2015  . Tobacco use disorder [F17.200] 03/26/2015  . 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 Depression   . Bipolar disorder (HCC)   . Anxiety     Past Surgical History  Procedure Laterality Date  . Lithotripsy    . Cholecystectomy     Family History: History reviewed. No pertinent family history.  Social History:  History  Alcohol Use  . 3.6 oz/week  . 6 Cans of beer per week    Comment: 6 pack of beer / day; pint whiskey     History  Drug Use  . Yes  . Special: Marijuana, Cocaine    Comment: oxycodone    Social History   Social History  .  Marital Status: Married    Spouse Name: N/A  . Number of Children: N/A  . Years of Education: N/A   Social History Main Topics  . Smoking status: Current Every Day Smoker -- 1.50 packs/day for 10 years    Types: Cigarettes  . Smokeless tobacco: Never Used  . Alcohol Use: 3.6 oz/week    6 Cans of beer per week     Comment: 6 pack of beer / day; pint whiskey  . Drug Use: Yes    Special: Marijuana, Cocaine     Comment: oxycodone  . Sexual Activity: Not Asked   Other Topics Concern  . None   Social History Narrative    Current Medications: Current Facility-Administered Medications  Medication Dose Route Frequency Provider Last Rate Last Dose  . acetaminophen (TYLENOL) tablet 650 mg  650 mg Oral Q6H PRN Jimmy Footman, MD      . alum & mag hydroxide-simeth (MAALOX/MYLANTA) 200-200-20 MG/5ML suspension 30 mL  30 mL Oral Q4H PRN Jimmy Footman, MD      . chlordiazePOXIDE (LIBRIUM) capsule 25 mg  25 mg Oral TID Jimmy Footman, MD   25 mg at 03/27/15 2102  . hydrOXYzine (ATARAX/VISTARIL) tablet 50 mg  50 mg Oral TID Jimmy Footman, MD   50 mg at 03/27/15 2102  . ibuprofen (ADVIL,MOTRIN) tablet 800 mg  800 mg Oral TID AC Jimmy Footman, MD   800 mg at 03/28/15 0840  . magnesium hydroxide (MILK OF MAGNESIA) suspension  30 mL  30 mL Oral Daily PRN Jimmy Footman, MD      . nicotine (NICODERM CQ - dosed in mg/24 hours) patch 21 mg  21 mg Transdermal Daily Jimmy Footman, MD   21 mg at 03/28/15 0841  . promethazine (PHENERGAN) tablet 25 mg  25 mg Oral TID AC Jimmy Footman, MD   25 mg at 03/28/15 0840  . traZODone (DESYREL) tablet 100 mg  100 mg Oral QHS Jimmy Footman, MD   100 mg at 03/27/15 2103    Lab Results:  No results found for this or any previous visit (from the past 48 hour(s)).  Blood Alcohol level:  Lab Results  Component Value Date   ETH <5 03/25/2015   ETH <5 10/03/2014     Physical Findings: AIMS: Facial and Oral Movements Muscles of Facial Expression: None, normal Lips and Perioral Area: None, normal Jaw: None, normal Tongue: None, normal,Extremity Movements Upper (arms, wrists, hands, fingers): None, normal Lower (legs, knees, ankles, toes): None, normal, Trunk Movements Neck, shoulders, hips: None, normal, Overall Severity Severity of abnormal movements (highest score from questions above): None, normal Incapacitation due to abnormal movements: None, normal Patient's awareness of abnormal movements (rate only patient's report): No Awareness, Dental Status Current problems with teeth and/or dentures?: No Does patient usually wear dentures?: No  CIWA:  CIWA-Ar Total: 2 COWS:  COWS Total Score: 4  Musculoskeletal: Strength & Muscle Tone: within normal limits Gait & Station: normal Patient leans: N/A  Psychiatric Specialty Exam: Review of Systems  Constitutional: Negative.   HENT: Negative.   Eyes: Negative.   Respiratory: Negative.   Cardiovascular: Negative.   Gastrointestinal: Negative.  Negative for nausea and vomiting.  Genitourinary: Negative.   Musculoskeletal: Negative.   Skin: Negative.   Neurological: Negative.  Negative for weakness.  Endo/Heme/Allergies: Negative.   Psychiatric/Behavioral: Positive for substance abuse. Negative for depression and suicidal ideas.    Blood pressure 135/89, pulse 80, temperature 98.4 F (36.9 C), temperature source Oral, resp. rate 20, height  (1.854 m), weight 95.255 kg (210 lb), SpO2 100 %.Body mass index is 27.71 kg/(m^2).  General Appearance: Casual  Eye Contact::  Good  Speech:  Clear and Coherent  Volume:  Normal  Mood:  Euthymic  Affect:  Constricted  Thought Process:  Linear  Orientation:  Full (Time, Place, and Person)  Thought Content:  Hallucinations: None  Suicidal Thoughts:  No  Homicidal Thoughts:  No  Memory:  Immediate;   Good Recent;   Good Remote;   Good   Judgement:  Fair  Insight:  Fair  Psychomotor Activity:  Decreased  Concentration:  Good  Recall:  Good  Fund of Knowledge:Good  Language: Good  Akathisia:  No  Handed:    AIMS (if indicated):     Assets:  Communication Skills  ADL's:  Intact  Cognition: WNL  Sleep:  Number of Hours: 8.3   Treatment Plan Summary:  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 At this time he does not appear to be meeting criteria for a major depressive disorder or bipolar disorder.    Insomnia: continue  trazodone 100 mg by mouth daily at bedtime  Opiate withdrawal: Patient will be treated symptomatically with NSAIDs, Imodium, phernergan, Flexeril, Vistaril.--- Symptoms of withdrawal are much less severe today  Alcohol withdrawal: Patient has been is started on Librium 25 mg by mouth 3 times a day.  Plan to discontinue Librium prior  to discharge  Tobacco use disorder: Patient has been is started on a nicotine patch 21 mg a day  Precautions every 15 minute checks  Hospitalization status voluntary  Disposition: referral made to [facility] on 2/22----pending admission  Jimmy Footman, MD 03/28/2015, 9:24 AM

## 2015-03-28 NOTE — BHH Suicide Risk Assessment (Signed)
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 Opioid use disorder, severe, dependence (HCC) [F11.20] 03/26/2015  . Alcohol use disorder, severe, dependence (HCC) [F10.20] 03/26/2015  . Tobacco use disorder [F17.200] 03/26/2015  . 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 [facility]) [F11.24, F32.89, F11.23] 03/26/2015      Psychiatric Specialty Exam: ROS                                                         Mental Status Per Nursing Assessment::   On Admission:  Suicidal ideation indicated by patient  Demographic Factors:  Male and Caucasian  Loss Factors: Loss of significant relationship  Historical Factors: Impulsivity  Risk Reduction Factors:   Responsible for children under 20 years of age, Sense of responsibility to family, Employed and Positive social support  Continued Clinical Symptoms:  Alcohol/Substance Abuse/Dependencies  Cognitive Features That Contribute To Risk:  None    Suicide Risk:  Minimal: No identifiable suicidal ideation.  Patients presenting with no risk factors but with morbid ruminations; may be classified as minimal risk based on the severity of the depressive symptoms  Follow-up Information    Follow up with [facility] On 04/01/2015.   Why:  Your hospital follow up appointment will be walk in. Walk in hours are Monday - Friday between 8:00am - 10:00am.    Contact information:   [address] [place] [address] [phone]      Jimmy Footman, MD 03/31/2015, 9:31 AM

## 2015-03-28 NOTE — BHH Group Notes (Signed)
[facility] LCSW Group Therapy  03/28/2015 3:13 PM  Patient was called to group but did not attend.  Jenel Lucks, CSW Intern  03/28/2015, 3:13 PM  Beryl Meager, MSW, LCSWA 03/28/2015, 3:38 PM

## 2015-03-28 NOTE — BHH Group Notes (Signed)
[facility] LCSW Aftercare Discharge Planning Group Note   03/28/2015 3:38 PM  Participation Quality:  Patient was called to group but did not attend.   Lulu Riding, MSW, LCSWA

## 2015-03-28 NOTE — Progress Notes (Signed)
Recreation Therapy Notes  Date: 02.24.17 Time: 3:10 pm Location: Community Room  Group Topic: Coping Skills  Goal Area(s) Addresses:  Patient will participate in a coping skill. Patient will verbalize benefit of using art as a coping skill.  Behavioral Response: Attentive  Intervention: Coloring  Activity: Patients were given coloring sheets and instructed to color while thinking about the emotions they are feeling and what their mind is focused on.  Education: LRT educated patients on healthy coping skills.  Education Outcome: In group clarification offered  Clinical Observations/Feedback: Patient colored coloring sheet. Patient did not contribute to group discussion.  Jacquelynn Cree, LRT/CTRS 03/28/2015 4:27 PM
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
## 2015-03-29 NOTE — Progress Notes (Addendum)
D: Pt denies SI/HI/AVH. Pt is pleasant and cooperative, affect is flat and sad, he appears anxious and restless. Patient was noted to be medication seeking with different medication request at different times. Patient isolates to his room  not interacting with peers and staff appropriately.  A: Pt was offered support and encouragement. Pt was given scheduled medications. Pt was encouraged to attend groups. Q 15 minute checks were done for safety.  R:Pt did not attend evening groups.  Pt is compliant with medication . Pt receptive to treatment and safety maintained on unit.

## 2015-03-29 NOTE — [facility] Group Notes (Signed)
[facility] LCSW Group Therapy  03/29/2015 5:05 PM  Type of Therapy:  Group Therapy  Participation Level:  Active  Participation Quality:  Attentive  Affect:  Appropriate  Cognitive:  Alert  Insight:  Improving  Engagement in Therapy:  Improving  Modes of Intervention:  Discussion, Education, Socialization and Support  Summary of Progress/Problems: Pt will identify unhealthy thoughts and how they impact their emotions and behavior. Pt will be encouraged to discuss these thoughts, emotions and behaviors with the group. Pt discussed changes that he needs to make once discharged. He states family gatherings involve alcohol and it would be hard for him to attend at the moment. He states this is a tradition but he needs to create new tradition. He reports his mother is supportive and if he were to attend, she would not allow him to drink.   [employer] MSW, LCSWA  03/29/2015, 5:05 PM

## 2015-03-29 NOTE — Plan of Care (Signed)
Problem: Alteration in mood Goal: LTG-Patient reports reduction in suicidal thoughts (Patient reports reduction in suicidal thoughts and is able to verbalize a safety plan for whenever patient is feeling suicidal)  Outcome: Progressing Patient does not endorse suicidal ideation this shift

## 2015-03-29 NOTE — Progress Notes (Signed)

## 2015-03-29 NOTE — Plan of Care (Signed)
Problem: Alteration in mood Goal: LTG-Pt's behavior demonstrates decreased signs of depression (Patient's behavior demonstrates decreased signs of depression to the point the patient is safe to return home and continue treatment in an outpatient setting)  Outcome: Progressing Patient states that he feels better today than he did yesterday with the exception of some anxiety

## 2015-03-29 NOTE — Progress Notes (Signed)
[facility] MD Progress Note  03/29/2015 7:52 PM [name]  MRN:  [id number]  Subjective:  [name] is somewhat disappointed that no bed is available at [facility] rehab. He decided to try [facility] program. Wants to be discharged early Monday morning. No somatic sympoms. He intends to refuse night time medication. Good group participation.  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 Opioid use disorder, severe, dependence (HCC) [F11.20] 03/26/2015  . Alcohol use disorder, severe, dependence (HCC) [F10.20] 03/26/2015  . Tobacco use disorder [F17.200] 03/26/2015  . 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  Past Medical History:  Past Medical History  Diagnosis Date  . Depression   . Bipolar disorder (HCC)   . Anxiety     Past Surgical History  Procedure Laterality Date  . Lithotripsy    . Cholecystectomy     Family History: History reviewed. No pertinent family history. Family Psychiatric  History: see H&P. Social History:  History  Alcohol Use  . 3.6 oz/week  . 6 Cans of beer per week    Comment: 6 pack of beer / day; pint whiskey     History  Drug Use  . Yes  . Special: Marijuana, Cocaine    Comment: oxycodone    Social History   Social History  . Marital Status: Married    Spouse Name: N/A  . Number of Children: N/A  . Years of Education: N/A   Social History Main Topics  . Smoking status: Current Every Day Smoker -- 1.50 packs/day for 10 years    Types: Cigarettes  . Smokeless tobacco: Never Used  . Alcohol Use: 3.6 oz/week    6 Cans of beer per week     Comment: 6 pack of beer / day; pint  whiskey  . Drug Use: Yes    Special: Marijuana, Cocaine     Comment: oxycodone  . Sexual Activity: Not Asked   Other Topics Concern  . None   Social History Narrative   Additional Social History:    Pain Medications: percocet History of alcohol / drug use?: Yes Negative Consequences of Use: Personal relationships Withdrawal Symptoms: Tremors Name of Substance 1: pain pills 1 - Age of First Use: 27 1 - Amount (size/oz): 75-100mg  1 - Frequency: daily Name of Substance 2: Alcohol 2 - Age of First Use: 15 2 - Amount (size/oz): 6 pack of beer and pint of whisky  2 - Frequency: daily Name of Substance 3: marijuana 3 - Age of First Use: 13 3 - Amount (size/oz): joint 3 - Frequency: several times per week              Sleep: Fair  Appetite:  Fair  Current Medications: Current Facility-Administered Medications  Medication Dose Route Frequency Provider Last Rate Last Dose  . acetaminophen (TYLENOL) tablet 650 mg  650 mg Oral Q6H PRN Jimmy Footman, MD      . alum & mag hydroxide-simeth (MAALOX/MYLANTA) 200-200-20 MG/5ML suspension 30 mL  30 mL Oral Q4H PRN Jimmy Footman, MD      . chlordiazePOXIDE (LIBRIUM) capsule 25 mg  25 mg Oral TID Jimmy Footman, MD   25 mg at 03/29/15 0951  . hydrOXYzine (ATARAX/VISTARIL)  tablet 50 mg  50 mg Oral TID Jimmy Footman, MD   50 mg at 03/29/15 0951  . ibuprofen (ADVIL,MOTRIN) tablet 800 mg  800 mg Oral TID AC Jimmy Footman, MD   800 mg at 03/29/15 1313  . magnesium hydroxide (MILK OF MAGNESIA) suspension 30 mL  30 mL Oral Daily PRN Jimmy Footman, MD      . nicotine (NICODERM CQ - dosed in mg/24 hours) patch 21 mg  21 mg Transdermal Daily Jimmy Footman, MD   21 mg at 03/29/15 6962  . promethazine (PHENERGAN) tablet 25 mg  25 mg Oral TID AC Jimmy Footman, MD   25 mg at 03/28/15 1727  . traZODone (DESYREL) tablet 100 mg  100 mg Oral QHS Jimmy Footman, MD   100 mg at 03/28/15 2121    Lab Results: No results found for this or any previous visit (from the past 48 hour(s)).  Blood Alcohol level:  Lab Results  Component Value Date   ETH <5 03/25/2015   ETH <5 10/03/2014    Physical Findings: AIMS: Facial and Oral Movements Muscles of Facial Expression: None, normal Lips and Perioral Area: None, normal Jaw: None, normal Tongue: None, normal,Extremity Movements Upper (arms, wrists, hands, fingers): None, normal Lower (legs, knees, ankles, toes): None, normal, Trunk Movements Neck, shoulders, hips: None, normal, Overall Severity Severity of abnormal movements (highest score from questions above): None, normal Incapacitation due to abnormal movements: None, normal Patient's awareness of abnormal movements (rate only patient's report): No Awareness, Dental Status Current problems with teeth and/or dentures?: No Does patient usually wear dentures?: No  CIWA:  CIWA-Ar Total: 2 COWS:  COWS Total Score: 4  Musculoskeletal: Strength & Muscle Tone: within normal limits Gait & Station: normal Patient leans: N/A  Psychiatric Specialty Exam: Review of Systems  Psychiatric/Behavioral: Positive for substance abuse.  All other systems reviewed and are negative.   Blood pressure 147/84, pulse 92, temperature 98.2 F (36.8 C), temperature source Oral, resp. rate 20, height  (1.854 m), weight 95.255 kg (210 lb), SpO2 100 %.Body mass index is 27.71 kg/(m^2).  General Appearance: Casual  Eye Contact::  Good  Speech:  Clear and Coherent  Volume:  Normal  Mood:  Euthymic  Affect:  Appropriate  Thought Process:  Goal Directed  Orientation:  Full (Time, Place, and Person)  Thought Content:  WDL  Suicidal Thoughts:  No  Homicidal Thoughts:  No  Memory:  Immediate;   Fair Recent;   Fair Remote;   Fair  Judgement:  Impaired  Insight:  Shallow  Psychomotor Activity:  Normal  Concentration:  Fair  Recall:  [employer] of Knowledge:Fair  Language: Fair  Akathisia:  No  Handed:  Right  AIMS (if indicated):     Assets:  Communication Skills Desire for Improvement Housing Physical Health Resilience Social Support  ADL's:  Intact  Cognition: WNL  Sleep:  Number of Hours: 8   Treatment Plan Summary: Daily contact with patient to assess and evaluate symptoms and progress in treatment and Medication management   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 At this time he does not appear to be meeting criteria for a major depressive disorder or bipolar disorder.   Insomnia: continue trazodone 100 mg by mouth daily at bedtime  Opiate withdrawal: Patient will be treated symptomatically with NSAIDs, Imodium, phernergan, Flexeril, Vistaril.--- Symptoms of withdrawal are much less severe today  Alcohol withdrawal:  Patient has been is started on Librium 25 mg by mouth 3 times a day. Plan to discontinue Librium prior to discharge  Tobacco use disorder: Patient has been is started on a nicotine patch 21 mg a day  Precautions every 15 minute checks  Hospitalization status voluntary  Disposition: referral made to [facility] on 2/22----pending admission  Kristine Linea, MD 03/29/2015, 7:52 PM

## 2015-03-29 NOTE — Plan of Care (Signed)
Problem: Alteration in mood Goal: LTG-Patient reports reduction in suicidal thoughts (Patient reports reduction in suicidal thoughts and is able to verbalize a safety plan for whenever patient is feeling suicidal)  Outcome: Progressing Denies SI     

## 2015-03-30 NOTE — Progress Notes (Signed)
[facility] MD Progress Note  03/30/2015 2:53 PM [name]  MRN:  [id number]   Subjective:  Mr. [name] has no complaints today. He believes he completed detox and refused most of his medications last night except for trazodone. He is eagerly awaiting discharge tomorrow he hopes that it will be every in the morning so his mother can take him to [facility]. He is excited to work with [employer]. He is very optimistic about the future.   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 Opioid use disorder, severe, dependence (HCC) [F11.20] 03/26/2015  . Alcohol use disorder, severe, dependence (HCC) [F10.20] 03/26/2015  . Tobacco use disorder [F17.200] 03/26/2015  . 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   Past Medical History:  Past Medical History  Diagnosis Date  . Depression   . Bipolar disorder (HCC)   . Anxiety     Past Surgical History  Procedure Laterality Date  . Lithotripsy    . Cholecystectomy     Family History: History reviewed. No pertinent family history. Family Psychiatric  History: See H&P.al History:  History  Alcohol Use  . 3.6 oz/week  . 6 Cans of beer per week    Comment: 6 pack of beer / day; pint whiskey     History  Drug Use  . Yes  . Special: Marijuana, Cocaine    Comment: oxycodone    Social History   Social History  . Marital Status: Married    Spouse Name: N/A  . Number of Children: N/A  . Years of Education: N/A   Social History Main Topics  . Smoking status: Current Every Day Smoker -- 1.50 packs/day for 10 years    Types: Cigarettes  . Smokeless tobacco: Never  Used  . Alcohol Use: 3.6 oz/week    6 Cans of beer per week     Comment: 6 pack of beer / day; pint whiskey  . Drug Use: Yes    Special: Marijuana, Cocaine     Comment: oxycodone  . Sexual Activity: Not Asked   Other Topics Concern  . None   Social History Narrative   Additional Social History:    Pain Medications: percocet History of alcohol / drug use?: Yes Negative Consequences of Use: Personal relationships Withdrawal Symptoms: Tremors Name of Substance 1: pain pills 1 - Age of First Use: 27 1 - Amount (size/oz): 75-100mg  1 - Frequency: daily Name of Substance 2: Alcohol 2 - Age of First Use: 15 2 - Amount (size/oz): 6 pack of beer and pint of whisky  2 - Frequency: daily Name of Substance 3: marijuana 3 - Age of First Use: 13 3 - Amount (size/oz): joint 3 - Frequency: several times per week              Sleep: Fair  Appetite:  Fair  Current Medications: Current Facility-Administered Medications  Medication Dose Route Frequency Provider Last Rate Last Dose  . acetaminophen (TYLENOL) tablet 650 mg  650 mg Oral Q6H PRN Jimmy Footman, MD   650 mg at 03/29/15 2116  . alum & mag hydroxide-simeth (MAALOX/MYLANTA) 200-200-20 MG/5ML suspension 30 mL  30 mL Oral Q4H PRN Jimmy Footman, MD      .  chlordiazePOXIDE (LIBRIUM) capsule 25 mg  25 mg Oral TID Jimmy Footman, MD   25 mg at 03/29/15 0951  . hydrOXYzine (ATARAX/VISTARIL) tablet 50 mg  50 mg Oral TID Jimmy Footman, MD   50 mg at 03/30/15 4782  . ibuprofen (ADVIL,MOTRIN) tablet 800 mg  800 mg Oral TID AC Jimmy Footman, MD   800 mg at 03/30/15 0904  . magnesium hydroxide (MILK OF MAGNESIA) suspension 30 mL  30 mL Oral Daily PRN Jimmy Footman, MD      . nicotine (NICODERM CQ - dosed in mg/24 hours) patch 21 mg  21 mg Transdermal Daily Jimmy Footman, MD   21 mg at 03/30/15 0904  . promethazine (PHENERGAN) tablet 25 mg  25 mg Oral TID AC  Jimmy Footman, MD   Stopped at 03/30/15 1206  . traZODone (DESYREL) tablet 100 mg  100 mg Oral QHS Jimmy Footman, MD   100 mg at 03/29/15 2116    Lab Results: No results found for this or any previous visit (from the past 48 hour(s)).  Blood Alcohol level:  Lab Results  Component Value Date   ETH <5 03/25/2015   ETH <5 10/03/2014    Physical Findings: AIMS: Facial and Oral Movements Muscles of Facial Expression: None, normal Lips and Perioral Area: None, normal Jaw: None, normal Tongue: None, normal,Extremity Movements Upper (arms, wrists, hands, fingers): None, normal Lower (legs, knees, ankles, toes): None, normal, Trunk Movements Neck, shoulders, hips: None, normal, Overall Severity Severity of abnormal movements (highest score from questions above): None, normal Incapacitation due to abnormal movements: None, normal Patient's awareness of abnormal movements (rate only patient's report): No Awareness, Dental Status Current problems with teeth and/or dentures?: No Does patient usually wear dentures?: No  CIWA:  CIWA-Ar Total: 2 COWS:  COWS Total Score: 4  Musculoskeletal: Strength & Muscle Tone: within normal limits Gait & Station: normal Patient leans: N/A  Psychiatric Specialty Exam: Review of Systems  All other systems reviewed and are negative.   Blood pressure 146/91, pulse 76, temperature 98.3 F (36.8 C), temperature source Oral, resp. rate 20, height  (1.854 m), weight 95.255 kg (210 lb), SpO2 100 %.Body mass index is 27.71 kg/(m^2).  General Appearance: Casual  Eye Contact::  Good  Speech:  Clear and Coherent  Volume:  Normal  Mood:  Euthymic  Affect:  Appropriate  Thought Process:  Goal Directed  Orientation:  Full (Time, Place, and Person)  Thought Content:  WDL  Suicidal Thoughts:  No  Homicidal Thoughts:  No  Memory:  Immediate;   Fair Recent;   Fair Remote;   Fair  Judgement:  Impaired  Insight:  Shallow   Psychomotor Activity:  Normal  Concentration:  Fair  Recall:  [employer] of Knowledge:Fair  Language: Fair  Akathisia:  No  Handed:  Right  AIMS (if indicated):     Assets:  Communication Skills Desire for Improvement Housing Physical Health Resilience Social Support  ADL's:  Intact  Cognition: WNL  Sleep:  Number of Hours: 7.5   Treatment Plan Summary: Daily contact with patient to assess and evaluate symptoms and progress in treatment and Medication management   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 At this time he does not appear to be meeting criteria for a major depressive disorder or bipolar disorder.   Insomnia: continue trazodone 100 mg by mouth daily at bedtime  Opiate withdrawal: Patient will be treated symptomatically  with NSAIDs, Imodium, phernergan, Flexeril, Vistaril.--- Symptoms of withdrawal are much less severe today  Alcohol withdrawal: Patient has been is started on Librium 25 mg by mouth 3 times a day. Plan to discontinue Librium prior to discharge  Tobacco use disorder: Patient has been is started on a nicotine patch 21 mg a day  Precautions every 15 minute checks  Hospitalization status voluntary  Disposition: referral made to [facility] on 2/22----pending admission   Kristine Linea, MD 03/30/2015, 2:53 PM

## 2015-03-30 NOTE — [facility] Group Notes (Signed)
[facility] Group Notes:  (Nursing/MHT/Case Management/Adjunct)  Date:  03/30/2015  Time:  11:57 AM  Type of Therapy:  Psychoeducational Skills  Participation Level:  Did Not Attend  Locklan Canoy Travis Nevin Grizzle 03/30/2015, 11:57 AM 

## 2015-03-30 NOTE — [facility] Group Notes (Signed)
[facility] LCSW Group Therapy  03/30/2015 3:56 PM  Type of Therapy:  Group Therapy  Participation Level:  Active   Participation Quality:  Attentive  Affect:  Flat  Cognitive:  Alert  Insight:  Limited  Engagement in Therapy:  Limited  Modes of Intervention:  Discussion, Education, Socialization and Support  Summary of Progress/Problems: Pts were asked to identify what balance means to them. They were encouraged to identify what throws them off balance and how to regain balance.  Pt attended group and stayed the entire time. He discussed the importance of having a routine.   [employer] MSW, LCSWA  03/30/2015, 3:56 PM

## 2015-03-30 NOTE — Plan of Care (Signed)
Problem: Alteration in mood Goal: LTG-Patient reports reduction in suicidal thoughts (Patient reports reduction in suicidal thoughts and is able to verbalize a safety plan for whenever patient is feeling suicidal)  Outcome: Progressing o SI/HI at this time.

## 2015-03-30 NOTE — [facility] Suicide Risk Assessment (Signed)
[facility] INPATIENT:  Family/Significant Other Suicide Prevention Education  Suicide Prevention Education:  Education Completed; [name] (mother) has been identified by the patient as the family member/significant other with whom the patient will be residing, and identified as the person(s) who will aid the patient in the event of a mental health crisis (suicidal ideations/suicide attempt).  With written consent from the patient, the family member/significant other has been provided the following suicide prevention education, prior to the and/or following the discharge of the patient.  The suicide prevention education provided includes the following:  Suicide risk factors  Suicide prevention and interventions  National Suicide Hotline telephone number  [facility] assessment telephone number  [facility] Emergency Assistance 911  [facility] and/or Residential Mobile Crisis Unit telephone number  Request made of family/significant other to:  Remove weapons (e.g., guns, rifles, knives), all items previously/currently identified as safety concern.    Remove drugs/medications (over-the-counter, prescriptions, illicit drugs), all items previously/currently identified as a safety concern.  The family member/significant other verbalizes understanding of the suicide prevention education information provided.  The family member/significant other agrees to remove the items of safety concern listed above.  [employer] MSW, LCSWA  03/30/2015, 4:56 PM

## 2015-03-30 NOTE — Plan of Care (Signed)
Problem: Alteration in mood & ability to function due to Goal: STG-Patient will report withdrawal symptoms Outcome: Progressing Pt denied withdrawal symptoms.

## 2015-03-30 NOTE — Progress Notes (Signed)
Patient with depressed affect, cooperative behavior with meals, meds and plan of care. No SI/HI at this time. Withdrawn and sleepy in bed this am. MD into visit. Safety maintained.

## 2015-03-30 NOTE — Progress Notes (Signed)
D: Observed pt (xxx). Patient alert and oriented x4. Patient denies SI/HI/AVH. Pt affect is appropriate to situation. Pt said his day "has been great." Pt mentioned that his truck recently got fixed, and he is looking forward to discharge. Pt rated depression 0/10 and anxiety 5/10. Pt denied symptoms of withdrawal. Pt expressed feeling "guilt." When asked about what pt learned in group pt stated "Finally found demons I've been searching for" but would not elaborate. Pt refused vistaril and librium, as he felt that he did not need it. A: Offered active listening and support. Provided therapeutic communication. Administered scheduled medications.  R: Pt pleasant and cooperative. Pt medication compliant, but refused librium and vistaril. Will continue Q15 min. checks. Safety maintained.

## 2015-03-31 NOTE — Progress Notes (Signed)
D:  Patient expresses readiness for discharge today.  Patient denies any pain currently.  Patient denies suicidal ideation, homicidal ideation, auditory or visual hallucinations currently. A:  Medications and instructions for their use were reviewed with the patient and she voiced understanding.  Discharge instructions and follow up were reviewed with the patient.  Patient's belongings were returned upon her leaving the unit. R:  Patient signed for the return of her belongings.  Patient was cooperative with the discharge process.  Patient expressed understanding of discharge instructions, follow up, medications and their use.  Patient was escorted off the unit.  Patient remains safe at the time of discharge.     

## 2015-03-31 NOTE — Progress Notes (Signed)
Recreation Therapy Notes  INPATIENT RECREATION TR PLAN  Patient Details Name: MAXI RODAS MRN: [id number] DOB: 1985/07/07 Today's Date: 03/31/2015  Rec Therapy Plan Is patient appropriate for Therapeutic Recreation?: Yes Treatment times per week: At least once a week TR Treatment/Interventions: 1:1 session, Group participation (Comment) (Appropriate participation in daily recreation therapy tx)  Discharge Criteria Pt will be discharged from therapy if:: Treatment goals are met, Discharged Treatment plan/goals/alternatives discussed and agreed upon by:: Patient/family  Discharge Summary Short term goals set: See Care Plan Short term goals met: Adequate for discharge Progress toward goals comments: One-to-one attended Which groups?: Coping skills One-to-one attended: Self-esteem, coping skills Reason goals not met: Patient d/c before goals could be met Therapeutic equipment acquired: None Reason patient discharged from therapy: Discharge from hospital Pt/family agrees with progress & goals achieved: Yes Date patient discharged from therapy: 03/31/15   Leonette Monarch, LRT/CTRS 03/31/2015, 11:34 AM

## 2015-03-31 NOTE — Progress Notes (Signed)
D: Alert and oriented x 4, affect is bright,no bizarre behavior or acute distress noted. Pt is pleasant and cooperative, appears less anxious and he is interacting with peers and staff appropriately.  A: Pt was offered support and encouragement. Pt was offered evening medications. Pt was encouraged to attend groups. Q 15 minute checks were done for safety.  R:Pt attends groups and interacts well with peers and staff. Pt took all evening medication except Librium he stated " I am not having any withdraw S/S". Pt receptive to treatment and safety maintained on unit.

## 2015-03-31 NOTE — Progress Notes (Signed)
  [facility] Adult Case Management Discharge Plan :  Will you be returning to the same living situation after discharge:  Yes,  Home with mom At discharge, do you have transportation home?: Mom to pick up. Do you have the ability to pay for your medications: Yes,     Release of information consent forms completed and in the chart;  Patient's signature needed at discharge.  Patient to Follow up at: Follow-up Information    Follow up with [facility] On 04/02/2015.   Why:  7:00am, Please follow up with Mr. Unk Pinto, Peer support Specialist [facility] [phone].  Your hospital follow up appointment will be walk in. Walk in hours are Monday - Friday between 8:00am - 10:00am.    Contact information:   [address] [facility] [id number] [phone]       Next level of care provider has access to [facility] Link:no  Safety Planning and Suicide Prevention discussed: Yes,     Have you used any form of tobacco in the last 30 days? (Cigarettes, Smokeless Tobacco, Cigars, and/or Pipes): Yes  Has patient been referred to the Quitline?: Patient refused referral  Patient has been referred for addiction treatment: Yes, Referred to [facility], no bed available at this time.  Jake Shark P,MSW, LCSW 03/31/2015, 10:14 AM

## 2015-03-31 NOTE — Plan of Care (Signed)
Problem: Alteration in mood & ability to function due to Goal: LTG-Pt reports reduction in suicidal thoughts (Patient reports reduction in suicidal thoughts and is able to verbalize a safety plan for whenever patient is feeling suicidal)  Outcome: Progressing Patient denies SI.      

## 2015-04-18 ENCOUNTER — Encounter: Payer: Self-pay | Admitting: Emergency Medicine

## 2015-04-18 ENCOUNTER — Emergency Department
Admission: EM | Admit: 2015-04-18 | Discharge: 2015-04-18 | Disposition: A | Discharge status: Home / Self Care | Payer: BLUE CROSS/BLUE SHIELD | Attending: Emergency Medicine | Admitting: Emergency Medicine

## 2015-04-18 DIAGNOSIS — F1499 Cocaine use, unspecified with unspecified cocaine-induced disorder: Secondary | ICD-10-CM | POA: Insufficient documentation

## 2015-04-18 DIAGNOSIS — F1299 Cannabis use, unspecified with unspecified cannabis-induced disorder: Secondary | ICD-10-CM | POA: Insufficient documentation

## 2015-04-18 DIAGNOSIS — F329 Major depressive disorder, single episode, unspecified: Secondary | ICD-10-CM | POA: Insufficient documentation

## 2015-04-18 DIAGNOSIS — F17210 Nicotine dependence, cigarettes, uncomplicated: Secondary | ICD-10-CM | POA: Insufficient documentation

## 2015-04-18 DIAGNOSIS — F1010 Alcohol abuse, uncomplicated: Secondary | ICD-10-CM | POA: Insufficient documentation

## 2015-04-18 DIAGNOSIS — J111 Influenza due to unidentified influenza virus with other respiratory manifestations: Secondary | ICD-10-CM | POA: Insufficient documentation

## 2015-04-18 MED ORDER — PSEUDOEPH-BROMPHEN-DM 30-2-10 MG/5ML PO SYRP: 10.0000 mL | ORAL_SOLUTION | Freq: Four times a day (QID) | ORAL | Status: DC | PRN

## 2015-04-18 MED ORDER — OSELTAMIVIR PHOSPHATE 75 MG PO CAPS: 75.0000 mg | ORAL_CAPSULE | Freq: Two times a day (BID) | ORAL | Status: DC

## 2015-04-18 NOTE — ED Notes (Signed)
Developed body aches   Cough and diarrhea for 2 days.

## 2015-04-18 NOTE — ED Provider Notes (Signed)
[facility] Emergency Department Provider Note  ____________________________________________  Time seen: Approximately 2:21 PM  I have reviewed the triage vital signs and the nursing notes.   HISTORY  Chief Complaint Influenza    HPI [name] is a 30 y.o. male, NAD, presents to the emergency department with sudden onset body aches, fatigue, cough, congestion, diarrhea as of last night. Woke this morning and had increasing body aches and fatigue. Went to work and was unable to finish his day due to the ill feeling. Notes his mother was diagnosed with influenza within the last couple of days. Denies any abdominal pain, nausea, vomiting. Has not had any headaches, ear pain, sore throat. No other sick contacts. Has not taken anything over-the-counter at this time. Has been able to eat and drink well.   Past Medical History  Diagnosis Date  . Depression   . Bipolar disorder (HCC)   . Anxiety     Patient Active Problem List   Diagnosis Date Noted  . Opioid use disorder, severe, dependence (HCC) 03/26/2015  . Alcohol use disorder, severe, dependence (HCC) 03/26/2015  . Tobacco use disorder 03/26/2015  . 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 Lithotripsy    . Cholecystectomy      Current Outpatient Rx  Name  Route  Sig  Dispense  Refill  . brompheniramine-pseudoephedrine-DM 30-2-10 MG/5ML syrup   Oral   Take 10 mLs by mouth 4 (four) times daily as needed.   200 mL   0   . cyclobenzaprine (FLEXERIL) 10 MG tablet   Oral   Take 1 tablet (10 mg total) by mouth 3 (three) times daily as needed for muscle spasms.   60 tablet   0   . oseltamivir (TAMIFLU) 75 MG capsule   Oral   Take 1 capsule (75 mg total) by mouth 2 (two) times daily.   10 capsule   0   . traZODone (DESYREL) 150 MG tablet   Oral   Take 1  tablet (150 mg total) by mouth at bedtime as needed for sleep.   30 tablet   0     Allergies Review of patient's allergies indicates no known allergies.  No family history on file.  Social History Social History  Substance Use Topics  . Smoking status: Current Every Day Smoker -- 1.50 packs/day for 10 years    Types: Cigarettes  . Smokeless tobacco: Never Used  . Alcohol Use: 3.6 oz/week    6 Cans of beer per week     Comment: 6 pack of beer / day; pint whiskey     Review of Systems  Constitutional: No fever but has had chills and fatigue Eyes: No visual changes. No discharge ENT: No sore throat, nasal congestion, runny nose, sneezing, ear pain. Cardiovascular: No chest pain. Respiratory: Positive chest congestion, cough. No shortness of breath. No wheezing.  Gastrointestinal: Positive diarrhea. No abdominal pain.  No nausea, vomiting.   Musculoskeletal: Positive for general myalgias. Skin: Negative for rash. Neurological: Negative for headaches, focal weakness or numbness. 10-point ROS otherwise negative.  ____________________________________________   PHYSICAL EXAM:  VITAL SIGNS: ED Triage Vitals  Enc Vitals Group     BP 04/18/15 1358 161/108 mmHg     Pulse Rate 04/18/15 1358 98     Resp 04/18/15 1358 18     Temp 04/18/15 1358 97.9 F (36.6 C)  Temp src --      SpO2 04/18/15 1358 98 %     Weight 04/18/15 1358 200 lb (90.719 kg)     Height 04/18/15 1358 6\' 1"  (1.854 m)     Head Cir --      Peak Flow --      Pain Score 04/18/15 1359 5     Pain Loc --      Pain Edu? --      Excl. in GC? --     Constitutional: Alert and oriented. Ill appearing but in no acute distress. Eyes: Conjunctivae are normal. PERRL. EOMI without pain.  Head: Atraumatic. ENT:      Ears: TMs visualized bilaterally without effusion, erythema, bulging, perforation.      Nose: No congestion/rhinnorhea.      Mouth/Throat: Mucous membranes are moist. Pharynx without erythema,  swelling, exudate. Neck: Supple with full range of motion. Hematological/Lymphatic/Immunilogical: No cervical lymphadenopathy. Cardiovascular: Normal rate, regular rhythm. Normal S1 and S2.  Good peripheral circulation. Respiratory: Normal respiratory effort without tachypnea or retractions. Lungs CTAB. Neurologic:  Normal speech and language. No gross focal neurologic deficits are appreciated.  Skin:  Skin is warm, dry and intact. No rash noted. Psychiatric: Mood and affect are normal. Speech and behavior are normal. Patient exhibits appropriate insight and judgement.   ____________________________________________   LABS  None  ____________________________________________  EKG  None ____________________________________________  RADIOLOGY  None ____________________________________________    PROCEDURES  Procedure(s) performed: None    Medications - No data to display   ____________________________________________   INITIAL IMPRESSION / ASSESSMENT AND PLAN / ED COURSE  Patient's diagnosis is consistent with influenza. Patient will be discharged home with prescriptions for Tamiflu and Bromfed-DM syrup to take as directed. May alternate Tylenol and ibuprofen as needed for aches and pain. Patient is to follow up with his primary care provider if symptoms persist past this treatment course. Patient is given ED precautions to return to the ED for any worsening or new symptoms.    ____________________________________________  FINAL CLINICAL IMPRESSION(S) / ED DIAGNOSES  Final diagnoses:  Influenza      NEW MEDICATIONS STARTED DURING THIS VISIT:  New Prescriptions   BROMPHENIRAMINE-PSEUDOEPHEDRINE-DM 30-2-10 MG/5ML SYRUP    Take 10 mLs by mouth 4 (four) times daily as needed.   OSELTAMIVIR (TAMIFLU) 75 MG CAPSULE    Take 1 capsule (75 mg total) by mouth 2 (two) times daily.         Hope PigeonJami L Raphael Fitzpatrick, PA-C 04/18/15 1455  Emily FilbertJonathan E Williams, MD 04/18/15  1501

## 2015-04-18 NOTE — Discharge Instructions (Signed)

## 2015-05-01 ENCOUNTER — Ambulatory Visit: Payer: Self-pay

## 2015-08-14 ENCOUNTER — Emergency Department
Admission: EM | Admit: 2015-08-14 | Discharge: 2015-08-14 | Disposition: A | Discharge status: Home / Self Care | Payer: BLUE CROSS/BLUE SHIELD | Attending: Emergency Medicine | Admitting: Emergency Medicine

## 2015-08-14 ENCOUNTER — Encounter: Payer: Self-pay | Admitting: *Deleted

## 2015-08-14 DIAGNOSIS — R1111 Vomiting without nausea: Secondary | ICD-10-CM

## 2015-08-14 DIAGNOSIS — F1490 Cocaine use, unspecified, uncomplicated: Secondary | ICD-10-CM | POA: Insufficient documentation

## 2015-08-14 DIAGNOSIS — F17210 Nicotine dependence, cigarettes, uncomplicated: Secondary | ICD-10-CM | POA: Insufficient documentation

## 2015-08-14 DIAGNOSIS — T675XXA Heat exhaustion, unspecified, initial encounter: Secondary | ICD-10-CM

## 2015-08-14 DIAGNOSIS — E860 Dehydration: Secondary | ICD-10-CM

## 2015-08-14 DIAGNOSIS — F1290 Cannabis use, unspecified, uncomplicated: Secondary | ICD-10-CM | POA: Insufficient documentation

## 2015-08-14 DIAGNOSIS — Z79899 Other long term (current) drug therapy: Secondary | ICD-10-CM | POA: Insufficient documentation

## 2015-08-14 DIAGNOSIS — F319 Bipolar disorder, unspecified: Secondary | ICD-10-CM | POA: Insufficient documentation

## 2015-08-14 LAB — COMPREHENSIVE METABOLIC PANEL
ALBUMIN: 3.7 g/dL (ref 3.5–5.0)
ALT: 37 U/L (ref 17–63)
ANION GAP: 8 (ref 5–15)
AST: 68 U/L — AB (ref 15–41)
Alkaline Phosphatase: 96 U/L (ref 38–126)
BUN: 13 mg/dL (ref 6–20)
CHLORIDE: 107 mmol/L (ref 101–111)
CO2: 26 mmol/L (ref 22–32)
Calcium: 8.4 mg/dL — ABNORMAL LOW (ref 8.9–10.3)
Creatinine, Ser: 0.95 mg/dL (ref 0.61–1.24)
GFR calc Af Amer: >60 mL/min (ref >60)
GFR calc non Af Amer: >60 mL/min (ref >60)
GLUCOSE: 98 mg/dL (ref 65–99)
POTASSIUM: 3.6 mmol/L (ref 3.5–5.1)
SODIUM: 141 mmol/L (ref 135–145)
TOTAL PROTEIN: 6.3 g/dL — AB (ref 6.5–8.1)
Total Bilirubin: 0.5 mg/dL (ref 0.3–1.2)

## 2015-08-14 LAB — CBC WITH DIFFERENTIAL/PLATELET
BASOS ABS: 0.1 10*3/uL (ref 0–0.1)
BASOS PCT: 1 %
EOS ABS: 0.1 10*3/uL (ref 0–0.7)
Eosinophils Relative: 1 %
HEMATOCRIT: 40.5 % (ref 40.0–52.0)
Hemoglobin: 14.3 g/dL (ref 13.0–18.0)
Lymphocytes Relative: 26 %
Lymphs Abs: 1.9 10*3/uL (ref 1.0–3.6)
MCH: 30.8 pg (ref 26.0–34.0)
MCHC: 35.3 g/dL (ref 32.0–36.0)
MCV: 87.4 fL (ref 80.0–100.0)
MONO ABS: 0.5 10*3/uL (ref 0.2–1.0)
MONOS PCT: 6 %
NEUTROS ABS: 4.8 10*3/uL (ref 1.4–6.5)
NEUTROS PCT: 66 %
Platelets: 208 10*3/uL (ref 150–440)
RBC: 4.63 MIL/uL (ref 4.40–5.90)
RDW: 13.3 % (ref 11.5–14.5)
WBC: 7.3 10*3/uL (ref 3.8–10.6)

## 2015-08-14 LAB — GLUCOSE, CAPILLARY: GLUCOSE-CAPILLARY: 100 mg/dL — AB (ref 65–99)

## 2015-08-14 LAB — CK: Total CK: 206 U/L (ref 49–397)

## 2015-08-14 MED ORDER — SODIUM CHLORIDE 0.9 % IV SOLN
Freq: Once | INTRAVENOUS | Status: AC
  Administered 2015-08-14: 10:00:00 via INTRAVENOUS

## 2015-08-14 MED ORDER — SODIUM CHLORIDE 0.9 % IV SOLN
1000.0000 mL | Freq: Once | INTRAVENOUS | Status: AC
  Administered 2015-08-14: 1000 mL via INTRAVENOUS

## 2015-08-14 MED ORDER — ONDANSETRON HCL 4 MG PO TABS: 4.0000 mg | ORAL_TABLET | Freq: Every day | ORAL | Status: DC | PRN

## 2015-08-14 MED ORDER — ONDANSETRON HCL 4 MG/2ML IJ SOLN
4.0000 mg | Freq: Once | INTRAMUSCULAR | Status: AC
  Administered 2015-08-14: 4 mg via INTRAVENOUS
  Filled 2015-08-14: qty 2

## 2015-08-14 NOTE — ED Notes (Signed)
Waiting on IV fluid to finish before DC per dr Mayford Knifewilliams

## 2015-08-14 NOTE — ED Notes (Signed)
States this AM began vomiting and feeling dehydrated, pt works outside everyday, denies any pain

## 2015-08-14 NOTE — ED Provider Notes (Signed)
[facility] Emergency Department Provider Note        Time seen: ----------------------------------------- 10:16 AM on 08/14/2015 -----------------------------------------    I have reviewed the triage vital signs and the nursing notes.   HISTORY  Chief Complaint Dehydration and Emesis    HPI [name] is a 30 y.o. male who presents the ER for vomiting and feeling dehydrated, also has a headache. Patient states he can't tolerate anything by mouth. Currently works outside in the heat every day, vomited this morning and his boss told him to not come to work today. He called his mother who told him to talk to a pharmacist who recommended he come to the ER. They're concerned he may collapse from dehydration. He denies other complaints at this time.   Past Medical History  Diagnosis Date  . Depression   . Bipolar disorder (HCC)   . Anxiety     Patient Active Problem List   Diagnosis Date Noted  . Opioid use disorder, severe, dependence (HCC) 03/26/2015  . Alcohol use disorder, severe, dependence (HCC) 03/26/2015  . Tobacco use disorder 03/26/2015  . 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 Lithotripsy    . Cholecystectomy      Allergies Review of patient's allergies indicates no known allergies.  Social History Social History  Substance Use Topics  . Smoking status: Current Every Day Smoker -- 1.50 packs/day for 10 years    Types: Cigarettes  . Smokeless tobacco: Never Used  . Alcohol Use: 3.6 oz/week    6 Cans of beer per week     Comment: 6 pack of beer / day; pint whiskey    Review of Systems Constitutional: Negative for fever. Cardiovascular: Negative for chest pain. Respiratory: Negative for shortness of breath. Gastrointestinal: Negative for abdominal pain, Positive for  vomiting Genitourinary: Negative for dysuria. Musculoskeletal: Negative for back pain. Skin: Negative for rash. Neurological: Positive for headache and weakness  10-point ROS otherwise negative.  ____________________________________________   PHYSICAL EXAM:  VITAL SIGNS: ED Triage Vitals  Enc Vitals Group     BP 08/14/15 1003 119/85 mmHg     Pulse Rate 08/14/15 1003 72     Resp 08/14/15 1003 18     Temp 08/14/15 1003 97.6 F (36.4 C)     Temp Source 08/14/15 1003 Oral     SpO2 08/14/15 1003 96 %     Weight 08/14/15 1003 215 lb (97.523 kg)     Height 08/14/15 1003  (1.854 m)     Head Cir --      Peak Flow --      Pain Score --      Pain Loc --      Pain Edu? --      Excl. in GC? --     Constitutional: Alert and oriented. Well appearing and in no distress. Eyes: Conjunctivae are normal. PERRL. Normal extraocular movements. ENT   Head: Normocephalic and atraumatic.   Nose: No congestion/rhinnorhea.   Mouth/Throat: Mucous membranes are moist.   Neck: No stridor. Cardiovascular: Normal rate, regular rhythm. No murmurs, rubs, or gallops. Respiratory: Normal respiratory effort without tachypnea nor retractions. Breath sounds are clear and equal bilaterally. No wheezes/rales/rhonchi. Gastrointestinal: Soft and nontender. Normal bowel sounds Musculoskeletal: Nontender with normal range of motion in all extremities. No lower extremity tenderness nor edema. Neurologic:  Normal speech and language.  No gross focal neurologic deficits are appreciated.  Skin:  Skin is warm, dry and intact. No rash noted. Psychiatric: Mood and affect are normal. Speech and behavior are normal.  ____________________________________________  ED COURSE:  Pertinent labs & imaging results that were available during my care of the patient were reviewed by me and considered in my medical decision making (see chart for details). Patients in no acute distress. Appears to have heat related  illness, possibly dehydrated. He'll receive IV fluids and antiemetics. ____________________________________________    LABS (pertinent positives/negatives)  Labs Reviewed  GLUCOSE, CAPILLARY - Abnormal; Notable for the following:    Glucose-Capillary 100 (*)    All other components within normal limits  COMPREHENSIVE METABOLIC PANEL - Abnormal; Notable for the following:    Calcium 8.4 (*)    Total Protein 6.3 (*)    AST 68 (*)    All other components within normal limits  CBC WITH DIFFERENTIAL/PLATELET  CK  URINALYSIS COMPLETEWITH MICROSCOPIC ([facility] ONLY)   ____________________________________________  FINAL ASSESSMENT AND PLAN  Heat related illness, dehydration, vomiting  Plan: Patient with labs as dictated above. Patient is in no acute distress, is tolerating liquids by mouth. He'll be discharged with antiemetics, encouraged to rehydrate by mouth when he gets home, avoid alcohol or drug use.   Emily FilbertWilliams, Tannon E, MD   Note: This dictation was prepared with Dragon dictation. Any transcriptional errors that result from this process are unintentional   Emily FilbertJonathan E Pernie Grosso, MD 08/14/15 1110

## 2015-08-14 NOTE — Discharge Instructions (Signed)
Dehydration, Adult Dehydration is a condition in which you do not have enough fluid or water in your body. It happens when you take in less fluid than you lose. Vital organs such as the kidneys, brain, and heart cannot function without a proper amount of fluids. Any loss of fluids from the body can cause dehydration.  Dehydration can range from mild to severe. This condition should be treated right away to help prevent it from becoming severe. CAUSES  This condition may be caused by:  Vomiting.  Diarrhea.  Excessive sweating, such as when exercising in hot or humid weather.  Not drinking enough fluid during strenuous exercise or during an illness.  Excessive urine output.  Fever.  Certain medicines. RISK FACTORS This condition is more likely to develop in:  People who are taking certain medicines that cause the body to lose excess fluid (diuretics).   People who have a chronic illness, such as diabetes, that may increase urination.  Older adults.   People who live at high altitudes.   People who participate in endurance sports.  SYMPTOMS  Mild Dehydration  Thirst.  Dry lips.  Slightly dry mouth.  Dry, warm skin. Moderate Dehydration  Very dry mouth.   Muscle cramps.   Dark urine and decreased urine production.   Decreased tear production.   Headache.   Light-headedness, especially when you stand up from a sitting position.  Severe Dehydration  Changes in skin.   Cold and clammy skin.   Skin does not spring back quickly when lightly pinched and released.   Changes in body fluids.   Extreme thirst.   No tears.   Not able to sweat when body temperature is high, such as in hot weather.   Minimal urine production.   Changes in vital signs.   Rapid, weak pulse (more than 100 beats per minute when you are sitting still).   Rapid breathing.   Low blood pressure.   Other changes.   Sunken eyes.   Cold hands and feet.    Confusion.  Lethargy and difficulty being awakened.  Fainting (syncope).   Short-term weight loss.   Unconsciousness. DIAGNOSIS  This condition may be diagnosed based on your symptoms. You may also have tests to determine how severe your dehydration is. These tests may include:   Urine tests.   Blood tests.  TREATMENT  Treatment for this condition depends on the severity. Mild or moderate dehydration can often be treated at home. Treatment should be started right away. Do not wait until dehydration becomes severe. Severe dehydration needs to be treated at the hospital. Treatment for Mild Dehydration  Drinking plenty of water to replace the fluid you have lost.   Replacing minerals in your blood (electrolytes) that you may have lost.  Treatment for Moderate Dehydration  Consuming oral rehydration solution (ORS). Treatment for Severe Dehydration  Receiving fluid through an IV tube.   Receiving electrolyte solution through a feeding tube that is passed through your nose and into your stomach (nasogastric tube or NG tube).  Correcting any abnormalities in electrolytes. HOME CARE INSTRUCTIONS   Drink enough fluid to keep your urine clear or pale yellow.   Drink water or fluid slowly by taking small sips. You can also try sucking on ice cubes.  Have food or beverages that contain electrolytes. Examples include bananas and sports drinks.  Take over-the-counter and prescription medicines only as told by your health care provider.   Prepare ORS according to the manufacturer's instructions. Take sips  of ORS every 5 minutes until your urine returns to normal.  If you have vomiting or diarrhea, continue to try to drink water, ORS, or both.   If you have diarrhea, avoid:   Beverages that contain caffeine.   Fruit juice.   Milk.   Carbonated soft drinks.  Do not take salt tablets. This can lead to the condition of having too much sodium in your body  (hypernatremia).  SEEK MEDICAL CARE IF:  You cannot eat or drink without vomiting.  You have had moderate diarrhea during a period of more than 24 hours.  You have a fever. SEEK IMMEDIATE MEDICAL CARE IF:   You have extreme thirst.  You have severe diarrhea.  You have not urinated in 6-8 hours, or you have urinated only a small amount of very dark urine.  You have shriveled skin.  You are dizzy, confused, or both.   This information is not intended to replace advice given to you by your health care provider. Make sure you discuss any questions you have with your health care provider.   Document Released: 01/18/2005 Document Revised: 10/09/2014 Document Reviewed: 06/05/2014 Elsevier Interactive Patient Education 2016 [employer]  Foot LockerHeat Exhaustion Information WHAT IS HEAT EXHAUSTION? Heat exhaustion happens when your body gets overheated from hot weather or from exercise. Heat exhaustion makes the temperature of your skin and body go up. Your body cools itself by sweating. If you do not drink enough water to replace what you sweat, you lose too much water and salt. This makes it harder for your body to produce more sweat. When you do not sweat enough, your body cannot cool down, and heat exhaustion may result. Heat exhaustion can lead to heatstroke, which is a more serious illness. WHO IS AT RISK FOR HEAT EXHAUSTION? Anyone can get heat exhaustion. However, heat exhaustion is more likely when you are exercising or doing a physical activity. It is also more likely when you are in hot and humid weather or bright sunshine. Heat exhaustion is also more likely to develop in:  People who are age 31 or older.  Children.  People who have a medical condition such as heart disease, poor circulation, sickle cell disease, or high blood pressure.  People who have a fever.  People who are very overweight (obese).  People who are dehydrated from:  Drinking alcohol or  caffeine.  Taking certain medicines, such as diuretics or stimulants. WHAT ARE THE SYMPTOMS OF HEAT EXHAUSTION? Symptoms of heat exhaustion include:  A body temperature of up to 104F (40C).  Moist, cool, and clammy skin.  Dizziness.  Headache.  Nausea.  Fatigue.  Thirst.  Dark-colored urine.  Rapid pulse or heartbeat.  Weakness.  Muscle cramps.  Confusion.  Fainting. WHAT SHOULD I DO IF I THINK I HAVE HEAT EXHAUSTION? If you think that you have heat exhaustion, call your health care provider. Follow his or her instructions. You should also:  Call a friend or a family member and ask someone to stay with you.  Move to a cooler location, such as:  Into the shade.  In front of a fan.  Someplace that has air conditioning.  Lie down and rest.  Slowly drink nonalcoholic, caffeine-free fluids.  Take off any extra clothing or tight-fitting clothes.  Take a cool bath or shower, if possible. If you do not have access to a bath or shower, dab or mist cool water on your skin. WHY IS IT IMPORTANT TO TREAT HEAT EXHAUSTION? It is  extremely important to take care of yourself and treat heat exhaustion as soon as possible. Untreated heat exhaustion can turn into heatstroke. Symptoms of heatstroke include:  A body temperature of 104F (40C) or higher.  Hot, red skin that may be dry or moist.  Severe headache.  Nausea and vomiting.  Muscle weakness and cramping.  Confusion.  Rapid breathing.  Fainting.  Seizure. These symptoms may represent a serious problem that is an emergency. Do not wait to see if the symptoms will go away. Get medical help right away. Call your local emergency services (911 in the [place] Do not drive yourself to the hospital. Heatstroke is a life-threatening condition that requires urgent medical treatment. Do not treat heatstroke at home. Heatstroke should be treated by a health care professional and may require hospitalization. At the  hospital, you may need to receive fluids through an IV tube:  If you cannot drink any fluids.  If you vomit any fluids that you drink.  If your symptoms do not get better after one hour.  If your symptoms get worse after one hour. HOW CAN I PREVENT HEAT EXHAUSTION?  Avoid outdoor activities on very hot or humid days.  Do not exercise or do other physical activity when you are not feeling well.  Drink plenty of nonalcoholic and caffeine-free fluids before and during physical activity.  Take frequent breaks for rest during physical activity.  Wear light-colored, loose-fitting, and lightweight clothing in the heat.  Wear a hat and use sunscreen when exercising outdoors.  Avoid being outside during the hottest times of the day.  Check with your health care provider before you start any new activity, especially if you take medicine or have a medical condition.  Start any new activity slowly and work up to your fitness level. HOW CAN I HELP TO PROTECT ELDERLY RELATIVES AND NEIGHBORS FROM HEAT EXHAUSTION? People who are age 31 or older are at greater risk for heat exhaustion. Their bodies have a harder time adjusting to heat. They are also more likely to have a medical condition or be on medicines that increase their risk for heat exhaustion. They may get heat exhaustion indoors if the heat is high for several days. You can help to protect them during hot weather by:  Checking on them two or more times each day.  Making sure that they are drinking plenty of cool, nonalcoholic, and caffeine-free fluids.  Making sure that they use their air conditioner.  Taking them to a location where air conditioning is available.  Talking with their health care provider about their medical needs, medicines, and fluid requirements.   This information is not intended to replace advice given to you by your health care provider. Make sure you discuss any questions you have with your health care  provider.   Document Released: 10/28/2007 Document Revised: 10/09/2014 Document Reviewed: 12/26/2013 Elsevier Interactive Patient Education [employer] Elsevier Inc.

## 2015-08-14 NOTE — ED Notes (Signed)
Given water, ok per dr williams 

## 2016-08-02 ENCOUNTER — Emergency Department (HOSPITAL_COMMUNITY)
Admission: EM | Admit: 2016-08-02 | Discharge: 2016-08-02 | Disposition: A | Discharge status: Home / Self Care | Payer: Self-pay | Attending: Emergency Medicine | Admitting: Emergency Medicine

## 2016-08-02 ENCOUNTER — Encounter (HOSPITAL_COMMUNITY): Payer: Self-pay

## 2016-08-02 ENCOUNTER — Emergency Department (HOSPITAL_COMMUNITY): Payer: Self-pay

## 2016-08-02 DIAGNOSIS — S37092A Other injury of left kidney, initial encounter: Secondary | ICD-10-CM | POA: Insufficient documentation

## 2016-08-02 DIAGNOSIS — S37002A Unspecified injury of left kidney, initial encounter: Secondary | ICD-10-CM

## 2016-08-02 DIAGNOSIS — Z79899 Other long term (current) drug therapy: Secondary | ICD-10-CM | POA: Insufficient documentation

## 2016-08-02 DIAGNOSIS — Y999 Unspecified external cause status: Secondary | ICD-10-CM | POA: Insufficient documentation

## 2016-08-02 DIAGNOSIS — X500XXA Overexertion from strenuous movement or load, initial encounter: Secondary | ICD-10-CM | POA: Insufficient documentation

## 2016-08-02 DIAGNOSIS — Y9389 Activity, other specified: Secondary | ICD-10-CM | POA: Insufficient documentation

## 2016-08-02 DIAGNOSIS — Y929 Unspecified place or not applicable: Secondary | ICD-10-CM | POA: Insufficient documentation

## 2016-08-02 DIAGNOSIS — N12 Tubulo-interstitial nephritis, not specified as acute or chronic: Secondary | ICD-10-CM | POA: Insufficient documentation

## 2016-08-02 DIAGNOSIS — Z791 Long term (current) use of non-steroidal anti-inflammatories (NSAID): Secondary | ICD-10-CM | POA: Insufficient documentation

## 2016-08-02 DIAGNOSIS — F17210 Nicotine dependence, cigarettes, uncomplicated: Secondary | ICD-10-CM | POA: Insufficient documentation

## 2016-08-02 LAB — COMPREHENSIVE METABOLIC PANEL
ALBUMIN: 3.3 g/dL — AB (ref 3.5–5.0)
ALT: 68 U/L — ABNORMAL HIGH (ref 17–63)
ANION GAP: 9 (ref 5–15)
AST: 63 U/L — ABNORMAL HIGH (ref 15–41)
Alkaline Phosphatase: 67 U/L (ref 38–126)
BILIRUBIN TOTAL: 1.1 mg/dL (ref 0.3–1.2)
BUN: 9 mg/dL (ref 6–20)
CO2: 25 mmol/L (ref 22–32)
Calcium: 8.3 mg/dL — ABNORMAL LOW (ref 8.9–10.3)
Chloride: 102 mmol/L (ref 101–111)
Creatinine, Ser: 1.2 mg/dL (ref 0.61–1.24)
GFR calc Af Amer: >60 mL/min (ref >60)
GFR calc non Af Amer: >60 mL/min (ref >60)
Glucose, Bld: 118 mg/dL — ABNORMAL HIGH (ref 65–99)
POTASSIUM: 3.4 mmol/L — AB (ref 3.5–5.1)
Sodium: 136 mmol/L (ref 135–145)
TOTAL PROTEIN: 5.8 g/dL — AB (ref 6.5–8.1)

## 2016-08-02 LAB — URINALYSIS, ROUTINE W REFLEX MICROSCOPIC
Bilirubin Urine: NEGATIVE
Glucose, UA: NEGATIVE mg/dL
Hgb urine dipstick: NEGATIVE
Ketones, ur: NEGATIVE mg/dL
LEUKOCYTES UA: NEGATIVE
NITRITE: NEGATIVE
PH: 7.5 (ref 5.0–8.0)
Protein, ur: 100 mg/dL — AB
SPECIFIC GRAVITY, URINE: 1.015 (ref 1.005–1.030)

## 2016-08-02 LAB — URINALYSIS, MICROSCOPIC (REFLEX)
BACTERIA UA: NONE SEEN
RBC / HPF: NONE SEEN RBC/hpf (ref 0–5)
Squamous Epithelial / LPF: NONE SEEN
WBC UA: NONE SEEN WBC/hpf (ref 0–5)

## 2016-08-02 LAB — CBC
HEMATOCRIT: 41.6 % (ref 39.0–52.0)
HEMOGLOBIN: 14.1 g/dL (ref 13.0–17.0)
MCH: 30 pg (ref 26.0–34.0)
MCHC: 33.9 g/dL (ref 30.0–36.0)
MCV: 88.5 fL (ref 78.0–100.0)
Platelets: 167 10*3/uL (ref 150–400)
RBC: 4.7 MIL/uL (ref 4.22–5.81)
RDW: 12.7 % (ref 11.5–15.5)
WBC: 12.6 10*3/uL — AB (ref 4.0–10.5)

## 2016-08-02 LAB — LIPASE, BLOOD: Lipase: 20 U/L (ref 11–51)

## 2016-08-02 MED ORDER — ONDANSETRON HCL 4 MG/2ML IJ SOLN
4.0000 mg | Freq: Once | INTRAMUSCULAR | Status: AC
  Administered 2016-08-02: 4 mg via INTRAVENOUS
  Filled 2016-08-02: qty 2

## 2016-08-02 MED ORDER — IOPAMIDOL (ISOVUE-300) INJECTION 61%
INTRAVENOUS | Status: AC
  Administered 2016-08-02: 100 mL
  Filled 2016-08-02: qty 100

## 2016-08-02 MED ORDER — NAPROXEN 500 MG PO TABS: 500.0000 mg | ORAL_TABLET | Freq: Two times a day (BID) | ORAL | 0 refills | Status: DC

## 2016-08-02 MED ORDER — ONDANSETRON 8 MG PO TBDP: 8.0000 mg | ORAL_TABLET | Freq: Three times a day (TID) | ORAL | 0 refills | Status: DC | PRN

## 2016-08-02 MED ORDER — SODIUM CHLORIDE 0.9 % IV SOLN
INTRAVENOUS | Status: DC
  Administered 2016-08-02: 10:00:00 via INTRAVENOUS

## 2016-08-02 MED ORDER — SODIUM CHLORIDE 0.9 % IV BOLUS (SEPSIS)
1000.0000 mL | Freq: Once | INTRAVENOUS | Status: AC
  Administered 2016-08-02: 1000 mL via INTRAVENOUS

## 2016-08-02 MED ORDER — HYDROMORPHONE HCL 1 MG/ML IJ SOLN
0.5000 mg | INTRAMUSCULAR | Status: DC | PRN
Start: 2016-08-02 — End: 2016-08-02
  Administered 2016-08-02 (×2): 0.5 mg via INTRAVENOUS
  Filled 2016-08-02 (×2): qty 0.5

## 2016-08-02 MED ORDER — TRAMADOL HCL 50 MG PO TABS: 50.0000 mg | ORAL_TABLET | Freq: Two times a day (BID) | ORAL | 0 refills | Status: DC | PRN

## 2016-08-02 NOTE — Discharge Instructions (Signed)
The CT scan showed inflammation of the left kidney. The cause of this is unclear at this point. Please follow up with the urologist as we discussed for further evaluation. Return for fever or worsening symptoms

## 2016-08-02 NOTE — ED Provider Notes (Signed)
[facility]-EMERGENCY DEPT Provider Note   CSN: [id number] Arrival date & time: 08/02/16  4098     History   Chief Complaint Chief Complaint  Patient presents with  . Abdominal Pain    HPI [name] is a 31 y.o. male.  The history is provided by the patient.  Abdominal Pain   This is a new problem. Episode onset: 2am on sunday. The problem occurs constantly. The problem has been gradually worsening. The pain is located in the LLQ. The quality of the pain is sharp. The pain is moderate. Associated symptoms include anorexia, diarrhea, nausea and vomiting. Pertinent negatives include fever, dysuria and hematuria. Associated symptoms comments: Radiates to testicle.  That started this am. The symptoms are aggravated by certain positions. Relieved by: sitting up. His past medical history is significant for gallstones. Past medical history comments: Kidney stones.    Past Medical History:  Diagnosis Date  . Anxiety   . Bipolar disorder (HCC)   . Depression     Patient Active Problem List   Diagnosis Date Noted  . Opioid use disorder, severe, dependence (HCC) 03/26/2015  . Alcohol use disorder, severe, dependence (HCC) 03/26/2015  . Tobacco use disorder 03/26/2015  . 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 CHOLECYSTECTOMY    . LITHOTRIPSY         Home Medications    Prior to Admission medications   Medication Sig Start Date End Date Taking? Authorizing Provider  acetaminophen (TYLENOL) 325 MG tablet Take 650 mg by mouth every 6 (six) hours as needed for mild pain.   Yes [provider]  ibuprofen (ADVIL,MOTRIN) 200 MG tablet Take 200 mg by mouth every 6 (six) hours as needed for moderate pain.   Yes [provider]  Multiple Vitamin (MULTIVITAMIN) tablet Take 1 tablet by mouth daily.   Yes [provider]    naproxen sodium (ANAPROX) 220 MG tablet Take 220 mg by mouth 2 (two) times daily as needed (pain).   Yes [provider]  brompheniramine-pseudoephedrine-DM 30-2-10 MG/5ML syrup Take 10 mLs by mouth 4 (four) times daily as needed. Patient not taking: Reported on 08/02/2016 04/18/15   Hagler, Jami L, PA-C  cyclobenzaprine (FLEXERIL) 10 MG tablet Take 1 tablet (10 mg total) by mouth 3 (three) times daily as needed for muscle spasms. Patient not taking: Reported on 08/02/2016 03/28/15   Jimmy Footman, MD  naproxen (NAPROSYN) 500 MG tablet Take 1 tablet (500 mg total) by mouth 2 (two) times daily with a meal. As needed for pain 08/02/16   Linwood Dibbles, MD  ondansetron (ZOFRAN ODT) 8 MG disintegrating tablet Take 1 tablet (8 mg total) by mouth every 8 (eight) hours as needed for nausea or vomiting. 08/02/16   Linwood Dibbles, MD  ondansetron (ZOFRAN) 4 MG tablet Take 1 tablet (4 mg total) by mouth daily as needed for nausea or vomiting. Patient not taking: Reported on 08/02/2016 08/14/15   Emily Filbert, MD  traMADol (ULTRAM) 50 MG tablet Take 1 tablet (50 mg total) by mouth every 12 (twelve) hours as needed. 08/02/16   Linwood Dibbles, MD  traZODone (DESYREL) 150 MG tablet Take 1 tablet (150 mg total) by mouth at bedtime as needed for sleep. Patient not taking: Reported on 08/02/2016 03/28/15   Jimmy Footman, MD    Family History No family history on file.  Social History Social History  Substance Use Topics  . Smoking status: Current Every Day Smoker    Packs/day: 0.50    Years: 10.00    Types: Cigarettes  . Smokeless tobacco: Never Used  . Alcohol use 3.6 oz/week    6 Cans of beer per week     Comment: 6 pack of beer / day; pint whiskey     Allergies   Patient has no known allergies.   Review of Systems Review of Systems  Constitutional: Negative for fever.  Gastrointestinal: Positive for abdominal pain, anorexia, diarrhea, nausea and vomiting.  Genitourinary:  Negative for dysuria and hematuria.  All other systems reviewed and are negative.    Physical Exam Updated Vital Signs BP (!) 148/92 (BP Location: Right Arm)   Pulse 93   Temp 98.6 F (37 C) (Oral)   Resp 18   Ht 1.854 m (6\' 1" )   Wt 97.5 kg (215 lb)   SpO2 99%   BMI 28.37 kg/m   Physical Exam  Constitutional: He appears well-developed and well-nourished. No distress.  HENT:  Head: Normocephalic and atraumatic.  Right Ear: External ear normal.  Left Ear: External ear normal.  Eyes: Conjunctivae are normal. Right eye exhibits no discharge. Left eye exhibits no discharge. No scleral icterus.  Neck: Neck supple. No tracheal deviation present.  Cardiovascular: Normal rate, regular rhythm and intact distal pulses.   Pulmonary/Chest: Effort normal and breath sounds normal. No stridor. No respiratory distress. He has no wheezes. He has no rales.  Abdominal: Soft. Bowel sounds are normal. He exhibits no distension. There is tenderness in the left lower quadrant. There is no rebound and no guarding.  Genitourinary: Testes normal. Right testis shows no mass. Left testis shows no mass.  Musculoskeletal: He exhibits no edema or tenderness.  Neurological: He is alert. He has normal strength. No cranial nerve deficit (no facial droop, extraocular movements intact, no slurred speech) or sensory deficit. He exhibits normal muscle tone. He displays no seizure activity. Coordination normal.  Skin: Skin is warm and dry. No rash noted.  Psychiatric: He has a normal mood and affect.  Nursing note and vitals reviewed.    ED Treatments / Results  Labs (all labs ordered are listed, but only abnormal results are displayed) Labs Reviewed  COMPREHENSIVE METABOLIC PANEL - Abnormal; Notable for the following:       Result Value   Potassium 3.4 (*)    Glucose, Bld 118 (*)    Calcium 8.3 (*)    Total Protein 5.8 (*)    Albumin 3.3 (*)    AST 63 (*)    ALT 68 (*)    All other components within  normal limits  CBC - Abnormal; Notable for the following:    WBC 12.6 (*)    All other components within normal limits  URINALYSIS, ROUTINE W REFLEX MICROSCOPIC - Abnormal; Notable for the following:    Protein, ur 100 (*)    All other components within normal limits  CULTURE, BLOOD (ROUTINE X 2)  CULTURE, BLOOD (ROUTINE X 2)  LIPASE, BLOOD  URINALYSIS, MICROSCOPIC (REFLEX)    EKG  EKG Interpretation None       Radiology Ct Abdomen Pelvis W Contrast  Result Date: 08/02/2016 CLINICAL DATA:  Lower left abdominal pain for 2 days. Nausea vomiting. EXAM: CT ABDOMEN AND PELVIS WITH CONTRAST TECHNIQUE: Multidetector CT imaging of the abdomen and pelvis was performed using the standard protocol following bolus administration of intravenous contrast. CONTRAST:  100mL ISOVUE-300 IOPAMIDOL (ISOVUE-300) INJECTION  61% COMPARISON:  01/26/2014 FINDINGS: Lower chest: No acute abnormality. Hepatobiliary: No focal liver abnormality is seen. Status post cholecystectomy. No biliary dilatation. Pancreas: Unremarkable. No pancreatic ductal dilatation or surrounding inflammatory changes. Spleen: Normal in size without focal abnormality. Adrenals/Urinary Tract: Normal bilateral adrenal glands. Normal right kidney. Nonspecific mild mucosal thickening of the urinary bladder. Geographic areas of hypoattenuation within the periphery of the left kidney present throughout the kidney parenchyma. No evidence of hydronephrosis. Mild left perinephric stranding. Stomach/Bowel: Stomach is within normal limits. No evidence of bowel wall thickening, distention, or inflammatory changes. Vascular/Lymphatic: No significant vascular findings are present. No enlarged abdominal or pelvic lymph nodes. Reproductive: Uterus and bilateral adnexa are unremarkable. Other: No abdominal wall hernia or abnormality. No abdominopelvic ascites. Musculoskeletal: No acute or significant osseous findings. IMPRESSION: Abnormal appearance of the left  kidney with geographic areas of hypoattenuation within its periphery present throughout the renal parenchyma. Differential diagnosis includes multifocal pyelonephritis, renal infarcts or infiltrative process such as lymphoma. Otherwise normal appearance of the abdomen and pelvis. Electronically Signed   By: Ted Mcalpine M.D.   On: 08/02/2016 11:54    Procedures Procedures (including critical care time)  Medications Ordered in ED Medications  sodium chloride 0.9 % bolus 1,000 mL (1,000 mLs Intravenous New Bag/Given 08/02/16 0941)    And  0.9 %  sodium chloride infusion ( Intravenous New Bag/Given 08/02/16 0941)  HYDROmorphone (DILAUDID) injection 0.5 mg (0.5 mg Intravenous Given 08/02/16 1036)  ondansetron (ZOFRAN) injection 4 mg (4 mg Intravenous Given 08/02/16 0934)  iopamidol (ISOVUE-300) 61 % injection (100 mLs  Contrast Given 08/02/16 1126)     Initial Impression / Assessment and Plan / ED Course  I have reviewed the triage vital signs and the nursing notes.  Pertinent labs & imaging results that were available during my care of the patient were reviewed by me and considered in my medical decision making (see chart for details).  Clinical Course as of Aug 03 1439  Mon Aug 02, 2016  1323 Discussed with Dr Abel Presto.  With normal renal function does not think he needs further nephrologic workup.  Doubts nephrotic syndrome.  The renal finding though would possibly need further workup.  Suggests urology consultation  [JK]  1440 I discussed the case with Dr. Berneice Heinrich.  He recommended adding on some blood cultures.  He recommended outpatient follow-up with urology  [JK]  1440 Discussed the findings with the patient. He denies any recent drug use specifically cocaine, heroin or any other drugs. He admits he had issues with opiates in the past but that was many years ago.   [JK]    Clinical Course User Index [JK] Linwood Dibbles, MD      Final Clinical Impressions(s) / ED Diagnoses   Final  diagnoses:  Injury of left kidney due to inflammation, initial encounter    New Prescriptions New Prescriptions   NAPROXEN (NAPROSYN) 500 MG TABLET    Take 1 tablet (500 mg total) by mouth 2 (two) times daily with a meal. As needed for pain   ONDANSETRON (ZOFRAN ODT) 8 MG DISINTEGRATING TABLET    Take 1 tablet (8 mg total) by mouth every 8 (eight) hours as needed for nausea or vomiting.   TRAMADOL (ULTRAM) 50 MG TABLET    Take 1 tablet (50 mg total) by mouth every 12 (twelve) hours as needed.     Linwood Dibbles, MD 08/02/16 [phone]

## 2016-08-02 NOTE — ED Triage Notes (Signed)
Per Pt, Pt is coming from home with complaints of LLQ pain that woke him from his sleep yesterday morning. Pt reports N/V/D along with the pain. Pain has been intermittent. Denies urinary symptoms.

## 2016-08-07 LAB — CULTURE, BLOOD (ROUTINE X 2)
CULTURE: NO GROWTH
Culture: NO GROWTH
Special Requests: ADEQUATE

## 2017-05-24 ENCOUNTER — Emergency Department (HOSPITAL_COMMUNITY): Payer: Self-pay | Admitting: Certified Registered Nurse Anesthetist

## 2017-05-24 ENCOUNTER — Encounter (HOSPITAL_COMMUNITY)
Admission: EM | Disposition: A | Discharge status: Home / Self Care | Payer: Self-pay | Source: Home / Self Care | Attending: Emergency Medicine

## 2017-05-24 ENCOUNTER — Encounter (HOSPITAL_COMMUNITY): Payer: Self-pay

## 2017-05-24 ENCOUNTER — Other Ambulatory Visit: Payer: Self-pay

## 2017-05-24 ENCOUNTER — Emergency Department (HOSPITAL_COMMUNITY): Payer: Self-pay

## 2017-05-24 ENCOUNTER — Observation Stay (HOSPITAL_COMMUNITY)
Admission: EM | Admit: 2017-05-24 | Discharge: 2017-05-25 | Disposition: A | Discharge status: Home / Self Care | Payer: Self-pay | Attending: Orthopedic Surgery | Admitting: Orthopedic Surgery

## 2017-05-24 DIAGNOSIS — Y9289 Other specified places as the place of occurrence of the external cause: Secondary | ICD-10-CM | POA: Insufficient documentation

## 2017-05-24 DIAGNOSIS — Z9889 Other specified postprocedural states: Secondary | ICD-10-CM

## 2017-05-24 DIAGNOSIS — W260XXA Contact with knife, initial encounter: Secondary | ICD-10-CM | POA: Insufficient documentation

## 2017-05-24 DIAGNOSIS — S62636B Displaced fracture of distal phalanx of right little finger, initial encounter for open fracture: Secondary | ICD-10-CM | POA: Insufficient documentation

## 2017-05-24 DIAGNOSIS — F419 Anxiety disorder, unspecified: Secondary | ICD-10-CM | POA: Insufficient documentation

## 2017-05-24 DIAGNOSIS — S5401XA Injury of ulnar nerve at forearm level, right arm, initial encounter: Secondary | ICD-10-CM | POA: Insufficient documentation

## 2017-05-24 DIAGNOSIS — S61219A Laceration without foreign body of unspecified finger without damage to nail, initial encounter: Secondary | ICD-10-CM | POA: Diagnosis present

## 2017-05-24 DIAGNOSIS — F319 Bipolar disorder, unspecified: Secondary | ICD-10-CM | POA: Insufficient documentation

## 2017-05-24 DIAGNOSIS — Y990 Civilian activity done for income or pay: Secondary | ICD-10-CM | POA: Insufficient documentation

## 2017-05-24 DIAGNOSIS — S5331XA Traumatic rupture of right ulnar collateral ligament, initial encounter: Principal | ICD-10-CM | POA: Insufficient documentation

## 2017-05-24 DIAGNOSIS — S68627A Partial traumatic transphalangeal amputation of left little finger, initial encounter: Secondary | ICD-10-CM

## 2017-05-24 DIAGNOSIS — Z79899 Other long term (current) drug therapy: Secondary | ICD-10-CM | POA: Insufficient documentation

## 2017-05-24 HISTORY — PX: AMPUTATION: SHX166

## 2017-05-24 SURGERY — AMPUTATION DIGIT
Anesthesia: General | Site: Finger | Laterality: Right

## 2017-05-24 MED ORDER — 0.9 % SODIUM CHLORIDE (POUR BTL) OPTIME
TOPICAL | Status: DC | PRN
  Administered 2017-05-24: 1000 mL

## 2017-05-24 MED ORDER — BUPIVACAINE HCL (PF) 0.25 % IJ SOLN
10.0000 mL | Freq: Once | INTRAMUSCULAR | Status: AC
  Administered 2017-05-24: 15:00:00

## 2017-05-24 MED ORDER — LIDOCAINE HCL (CARDIAC) PF 100 MG/5ML IV SOSY
PREFILLED_SYRINGE | INTRAVENOUS | Status: DC | PRN
  Administered 2017-05-24: 100 mg via INTRAVENOUS

## 2017-05-24 MED ORDER — POVIDONE-IODINE 10 % EX SWAB: 2.0000 "application " | Freq: Once | CUTANEOUS | Status: DC

## 2017-05-24 MED ORDER — PROPOFOL 10 MG/ML IV BOLUS
INTRAVENOUS | Status: AC
  Filled 2017-05-24: qty 20

## 2017-05-24 MED ORDER — FENTANYL CITRATE (PF) 100 MCG/2ML IJ SOLN
INTRAMUSCULAR | Status: AC
  Administered 2017-05-24: 50 ug via INTRAVENOUS
  Filled 2017-05-24: qty 2

## 2017-05-24 MED ORDER — MIDAZOLAM HCL 2 MG/2ML IJ SOLN
INTRAMUSCULAR | Status: AC
  Filled 2017-05-24: qty 2

## 2017-05-24 MED ORDER — SUCCINYLCHOLINE CHLORIDE 20 MG/ML IJ SOLN
INTRAMUSCULAR | Status: DC | PRN
  Administered 2017-05-24: 100 mg via INTRAVENOUS

## 2017-05-24 MED ORDER — BUPIVACAINE HCL 0.25 % IJ SOLN
10.0000 mL | Freq: Once | INTRAMUSCULAR | Status: DC
  Filled 2017-05-24: qty 10

## 2017-05-24 MED ORDER — TRAMADOL HCL 50 MG PO TABS
100.0000 mg | ORAL_TABLET | Freq: Four times a day (QID) | ORAL | Status: DC
  Administered 2017-05-25: 100 mg via ORAL
  Filled 2017-05-24: qty 2

## 2017-05-24 MED ORDER — CEFAZOLIN SODIUM-DEXTROSE 2-4 GM/100ML-% IV SOLN
INTRAVENOUS | Status: AC
  Filled 2017-05-24: qty 100

## 2017-05-24 MED ORDER — DEXAMETHASONE SODIUM PHOSPHATE 4 MG/ML IJ SOLN
INTRAMUSCULAR | Status: DC | PRN
  Administered 2017-05-24: 10 mg via INTRAVENOUS

## 2017-05-24 MED ORDER — CHLORHEXIDINE GLUCONATE 4 % EX LIQD: 60.0000 mL | Freq: Once | CUTANEOUS | Status: DC

## 2017-05-24 MED ORDER — FENTANYL CITRATE (PF) 100 MCG/2ML IJ SOLN
50.0000 ug | Freq: Once | INTRAMUSCULAR | Status: AC
  Administered 2017-05-24: 50 ug via INTRAVENOUS

## 2017-05-24 MED ORDER — ONDANSETRON HCL 4 MG/2ML IJ SOLN: 4.0000 mg | Freq: Once | INTRAMUSCULAR | Status: DC | PRN

## 2017-05-24 MED ORDER — OXYCODONE HCL 5 MG PO TABS
ORAL_TABLET | ORAL | Status: AC
  Administered 2017-05-24: 10 mg via ORAL
  Filled 2017-05-24: qty 2

## 2017-05-24 MED ORDER — FENTANYL CITRATE (PF) 100 MCG/2ML IJ SOLN: 50.0000 ug | Freq: Once | INTRAMUSCULAR | Status: DC

## 2017-05-24 MED ORDER — METHOCARBAMOL 500 MG PO TABS
ORAL_TABLET | ORAL | Status: AC
  Administered 2017-05-24: 500 mg via ORAL
  Filled 2017-05-24: qty 1

## 2017-05-24 MED ORDER — HYDROMORPHONE HCL 1 MG/ML IJ SOLN
INTRAMUSCULAR | Status: DC | PRN
  Administered 2017-05-24: 0.5 mg via INTRAVENOUS

## 2017-05-24 MED ORDER — CEFAZOLIN SODIUM-DEXTROSE 1-4 GM/50ML-% IV SOLN
1.0000 g | Freq: Three times a day (TID) | INTRAVENOUS | Status: DC
  Administered 2017-05-25 (×2): 1 g via INTRAVENOUS
  Filled 2017-05-24 (×3): qty 50

## 2017-05-24 MED ORDER — OXYCODONE HCL 5 MG PO TABS
5.0000 mg | ORAL_TABLET | ORAL | Status: DC | PRN
  Administered 2017-05-24 – 2017-05-25 (×2): 10 mg via ORAL
  Filled 2017-05-24: qty 2

## 2017-05-24 MED ORDER — ALPRAZOLAM 0.5 MG PO TABS
0.5000 mg | ORAL_TABLET | Freq: Four times a day (QID) | ORAL | Status: DC | PRN
  Administered 2017-05-24: 0.5 mg via ORAL
  Filled 2017-05-24 (×2): qty 1

## 2017-05-24 MED ORDER — ONDANSETRON HCL 4 MG/2ML IJ SOLN
INTRAMUSCULAR | Status: DC | PRN
  Administered 2017-05-24: 4 mg via INTRAVENOUS

## 2017-05-24 MED ORDER — METHOCARBAMOL 1000 MG/10ML IJ SOLN: 500.0000 mg | Freq: Four times a day (QID) | INTRAVENOUS | Status: DC | PRN

## 2017-05-24 MED ORDER — FENTANYL CITRATE (PF) 100 MCG/2ML IJ SOLN
25.0000 ug | INTRAMUSCULAR | Status: DC | PRN
  Administered 2017-05-24 (×2): 50 ug via INTRAVENOUS

## 2017-05-24 MED ORDER — OXYCODONE HCL 5 MG/5ML PO SOLN: 5.0000 mg | Freq: Once | ORAL | Status: DC | PRN

## 2017-05-24 MED ORDER — METHOCARBAMOL 500 MG PO TABS
500.0000 mg | ORAL_TABLET | Freq: Four times a day (QID) | ORAL | Status: DC | PRN
  Administered 2017-05-24 – 2017-05-25 (×2): 500 mg via ORAL
  Filled 2017-05-24: qty 1

## 2017-05-24 MED ORDER — MIDAZOLAM HCL 5 MG/5ML IJ SOLN
INTRAMUSCULAR | Status: DC | PRN
  Administered 2017-05-24: 2 mg via INTRAVENOUS

## 2017-05-24 MED ORDER — CEFAZOLIN SODIUM-DEXTROSE 1-4 GM/50ML-% IV SOLN: 1.0000 g | INTRAVENOUS | Status: DC

## 2017-05-24 MED ORDER — MORPHINE SULFATE (PF) 2 MG/ML IV SOLN
2.0000 mg | INTRAVENOUS | Status: DC | PRN
  Administered 2017-05-24 – 2017-05-25 (×2): 2 mg via INTRAVENOUS
  Filled 2017-05-24: qty 1

## 2017-05-24 MED ORDER — MORPHINE SULFATE (PF) 4 MG/ML IV SOLN
INTRAVENOUS | Status: AC
  Filled 2017-05-24: qty 1

## 2017-05-24 MED ORDER — SODIUM CHLORIDE 0.9 % IR SOLN
Status: DC | PRN
  Administered 2017-05-24: 3000 mL

## 2017-05-24 MED ORDER — OXYCODONE HCL 5 MG PO TABS: 5.0000 mg | ORAL_TABLET | Freq: Once | ORAL | Status: DC | PRN

## 2017-05-24 MED ORDER — CEFAZOLIN SODIUM-DEXTROSE 2-4 GM/100ML-% IV SOLN
2.0000 g | INTRAVENOUS | Status: AC
  Administered 2017-05-24: 2 g via INTRAVENOUS

## 2017-05-24 MED ORDER — FENTANYL CITRATE (PF) 100 MCG/2ML IJ SOLN
INTRAMUSCULAR | Status: DC | PRN
  Administered 2017-05-24: 100 ug via INTRAVENOUS
  Administered 2017-05-24: 50 ug via INTRAVENOUS

## 2017-05-24 MED ORDER — LACTATED RINGERS IV SOLN
INTRAVENOUS | Status: DC
Start: 2017-05-24 — End: 2017-05-25
  Administered 2017-05-24 (×2): via INTRAVENOUS

## 2017-05-24 MED ORDER — BUPIVACAINE HCL (PF) 0.25 % IJ SOLN
INTRAMUSCULAR | Status: AC
  Filled 2017-05-24: qty 30

## 2017-05-24 MED ORDER — LIDOCAINE HCL 2 % IJ SOLN
10.0000 mL | Freq: Once | INTRAMUSCULAR | Status: AC
  Administered 2017-05-24: 200 mg

## 2017-05-24 MED ORDER — PROPOFOL 10 MG/ML IV BOLUS
INTRAVENOUS | Status: DC | PRN
  Administered 2017-05-24: 180 mg via INTRAVENOUS

## 2017-05-24 MED ORDER — FENTANYL CITRATE (PF) 250 MCG/5ML IJ SOLN
INTRAMUSCULAR | Status: AC
  Filled 2017-05-24: qty 5

## 2017-05-24 SURGICAL SUPPLY — 52 items
0.035" x 4" K-Wire ×2 IMPLANT
BANDAGE ACE 4X5 VEL STRL LF (GAUZE/BANDAGES/DRESSINGS) ×2 IMPLANT
BNDG COHESIVE 1X5 TAN STRL LF (GAUZE/BANDAGES/DRESSINGS) IMPLANT
BNDG COHESIVE 3X5 TAN STRL LF (GAUZE/BANDAGES/DRESSINGS) ×2 IMPLANT
BNDG CONFORM 2 STRL LF (GAUZE/BANDAGES/DRESSINGS) IMPLANT
BNDG GAUZE ELAST 4 BULKY (GAUZE/BANDAGES/DRESSINGS) ×8 IMPLANT
CORDS BIPOLAR (ELECTRODE) ×2 IMPLANT
COVER SURGICAL LIGHT HANDLE (MISCELLANEOUS) ×2 IMPLANT
CUFF TOURNIQUET SINGLE 18IN (TOURNIQUET CUFF) ×2 IMPLANT
CUFF TOURNIQUET SINGLE 24IN (TOURNIQUET CUFF) IMPLANT
DRAPE OEC MINIVIEW 54X84 (DRAPES) ×2 IMPLANT
DRAPE SURG 17X23 STRL (DRAPES) ×2 IMPLANT
DRSG MEPITEL 4X7.2 (GAUZE/BANDAGES/DRESSINGS) ×2 IMPLANT
GAUZE SPONGE 2X2 8PLY STRL LF (GAUZE/BANDAGES/DRESSINGS) IMPLANT
GAUZE SPONGE 4X4 12PLY STRL (GAUZE/BANDAGES/DRESSINGS) ×2 IMPLANT
GAUZE XEROFORM 1X8 LF (GAUZE/BANDAGES/DRESSINGS) ×2 IMPLANT
GLOVE BIOGEL M 8.0 STRL (GLOVE) IMPLANT
GLOVE SS BIOGEL STRL SZ 8 (GLOVE) ×1 IMPLANT
GLOVE SUPERSENSE BIOGEL SZ 8 (GLOVE) ×1
GOWN STRL REUS W/ TWL LRG LVL3 (GOWN DISPOSABLE) ×1 IMPLANT
GOWN STRL REUS W/ TWL XL LVL3 (GOWN DISPOSABLE) ×1 IMPLANT
GOWN STRL REUS W/TWL LRG LVL3 (GOWN DISPOSABLE) ×1
GOWN STRL REUS W/TWL XL LVL3 (GOWN DISPOSABLE) ×1
KIT BASIN OR (CUSTOM PROCEDURE TRAY) ×2 IMPLANT
KIT TURNOVER KIT B (KITS) ×2 IMPLANT
MANIFOLD NEPTUNE II (INSTRUMENTS) ×2 IMPLANT
NEEDLE HYPO 25GX1X1/2 BEV (NEEDLE) ×2 IMPLANT
NS IRRIG 1000ML POUR BTL (IV SOLUTION) ×2 IMPLANT
PACK ORTHO EXTREMITY (CUSTOM PROCEDURE TRAY) ×2 IMPLANT
PAD ARMBOARD 7.5X6 YLW CONV (MISCELLANEOUS) ×4 IMPLANT
PAD CAST 4YDX4 CTTN HI CHSV (CAST SUPPLIES) ×1 IMPLANT
PADDING CAST COTTON 4X4 STRL (CAST SUPPLIES) ×1
SCRUB BETADINE 4OZ XXX (MISCELLANEOUS) ×2 IMPLANT
SET CYSTO W/LG BORE CLAMP LF (SET/KITS/TRAYS/PACK) ×2 IMPLANT
SOL PREP POV-IOD 4OZ 10% (MISCELLANEOUS) ×2 IMPLANT
SPECIMEN JAR SMALL (MISCELLANEOUS) IMPLANT
SPLINT FIBERGLASS 3X12 (CAST SUPPLIES) ×2 IMPLANT
SPONGE GAUZE 2X2 STER 10/PKG (GAUZE/BANDAGES/DRESSINGS)
SUT CHROMIC 5 0 P 3 (SUTURE) ×2 IMPLANT
SUT ETHILON 8 0 TG100 8 (SUTURE) ×2 IMPLANT
SUT FIBER WIRE 4.0 (SUTURE) ×6 IMPLANT
SUT MERSILENE 4 0 P 3 (SUTURE) IMPLANT
SUT PROLENE 4 0 PS 2 18 (SUTURE) ×2 IMPLANT
SUT PROLENE 5 0 P 3 (SUTURE) ×2 IMPLANT
SUT PROLENE 5 0 PS 2 (SUTURE) ×2 IMPLANT
SUT VIC AB 2-0 CT2 18 VCP726D (SUTURE) ×2 IMPLANT
SYR CONTROL 10ML LL (SYRINGE) ×2 IMPLANT
TOWEL OR 17X24 6PK STRL BLUE (TOWEL DISPOSABLE) IMPLANT
TOWEL OR 17X26 10 PK STRL BLUE (TOWEL DISPOSABLE) ×2 IMPLANT
TUBE CONNECTING 12X1/4 (SUCTIONS) ×2 IMPLANT
UNDERPAD 30X30 (UNDERPADS AND DIAPERS) ×2 IMPLANT
WATER STERILE IRR 1000ML POUR (IV SOLUTION) IMPLANT

## 2017-05-24 NOTE — ED Provider Notes (Signed)
[facility] EMERGENCY DEPARTMENT Provider Note   CSN: [id number] Arrival date & time: 05/24/17  1246     History   Chief Complaint Chief Complaint  Patient presents with  . Laceration    HPI [name] is a 32 y.o. male.  33yo M w/ PMH including bipolar d/o who p/w finger laceration. Just PTA, pt was cutting with a hunting knife and cut the tip of his R 5th finger and grazed his 4th finger. Bleeding controlled PTA. He has severe, constant pain in his 5th finger and numbness of the tip. No other injuries. Tetanus UTD.  The history is provided by the patient.    Past Medical History:  Diagnosis Date  . Anxiety   . Bipolar disorder (HCC)   . Depression     Patient Active Problem List   Diagnosis Date Noted  . Opioid use disorder, severe, dependence (HCC) 03/26/2015  . Alcohol use disorder, severe, dependence (HCC) 03/26/2015  . Tobacco use disorder 03/26/2015  . 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 CHOLECYSTECTOMY    . LITHOTRIPSY          Home Medications    Prior to Admission medications   Medication Sig Start Date End Date Taking? Authorizing Provider  acetaminophen (TYLENOL) 325 MG tablet Take 650 mg by mouth every 6 (six) hours as needed for mild pain.    [provider]  brompheniramine-pseudoephedrine-DM 30-2-10 MG/5ML syrup Take 10 mLs by mouth 4 (four) times daily as needed. Patient not taking: Reported on 08/02/2016 04/18/15   Hagler, Jami L, PA-C  cyclobenzaprine (FLEXERIL) 10 MG tablet Take 1 tablet (10 mg total) by mouth 3 (three) times daily as needed for muscle spasms. Patient not taking: Reported on 08/02/2016 03/28/15   Jimmy Footman, MD  ibuprofen (ADVIL,MOTRIN) 200 MG tablet Take 200 mg by mouth every 6 (six) hours as needed for moderate pain.    [provider]  Multiple Vitamin (MULTIVITAMIN) tablet Take 1 tablet by mouth daily.    [provider]  naproxen (NAPROSYN) 500 MG tablet Take 1 tablet (500 mg total) by mouth 2 (two) times daily with a meal. As needed for pain 08/02/16   Linwood Dibbles, MD  naproxen sodium (ANAPROX) 220 MG tablet Take 220 mg by mouth 2 (two) times daily as needed (pain).    [provider]  ondansetron (ZOFRAN ODT) 8 MG disintegrating tablet Take 1 tablet (8 mg total) by mouth every 8 (eight) hours as needed for nausea or vomiting. 08/02/16   Linwood Dibbles, MD  ondansetron (ZOFRAN) 4 MG tablet Take 1 tablet (4 mg total) by mouth daily as needed for nausea or vomiting. Patient not taking: Reported on 08/02/2016 08/14/15   Emily Filbert, MD  traMADol (ULTRAM) 50 MG tablet Take 1 tablet (50 mg total) by mouth every 12 (twelve) hours as needed. 08/02/16   Linwood Dibbles, MD  traZODone (DESYREL) 150 MG tablet Take 1 tablet (150 mg total) by mouth at bedtime as needed for sleep. Patient not taking: Reported on 08/02/2016 03/28/15   Jimmy Footman, MD    Family History No family history on file.  Social History Social History   Tobacco Use  . Smoking status: Current Every Day Smoker    Packs/day: 1.50    Years: 10.00    Pack years: 15.00    Types: Cigarettes  .  Smokeless tobacco: Never Used  Substance Use Topics  . Alcohol use: Not Currently    Comment: occ   . Drug use: Not Currently    Types: Marijuana, Cocaine    Comment: oxycodone     Allergies   Patient has no known allergies.   Review of Systems Review of Systems All other systems reviewed and are negative except that which was mentioned in HPI   Physical Exam Updated Vital Signs BP 109/86   Temp 97.9 F (36.6 C) (Oral)   Resp (!) 22   Ht 6\' 1"  (1.854 m)   Wt 97.5 kg (215 lb)   SpO2 100%   BMI 28.37 kg/m   Physical Exam  Constitutional: He is oriented to person, place, and time. He appears well-developed and well-nourished.  He appears distressed.  Tearful, in distress due to pain  HENT:  Head: Normocephalic and atraumatic.  Eyes: Conjunctivae are normal.  Neck: Neck supple.  Musculoskeletal: He exhibits deformity.  Near-amputation of R 5th finger at DIP joint with complete severance of bone, tip is dusky; no arterial bleeding  Neurological: He is alert and oriented to person, place, and time.  Skin: Skin is warm and dry.  Psychiatric:  Distressed, anxious  Nursing note and vitals reviewed.    ED Treatments / Results  Labs (all labs ordered are listed, but only abnormal results are displayed) Labs Reviewed - No data to display  EKG None  Radiology No results found.  Procedures .Nerve Block Date/Time: 05/24/2017 1:29 PM Performed by: Laurence SpatesLittle, Rachel Morgan, MD Authorized by: Laurence SpatesLittle, Rachel Morgan, MD   Consent:    Consent obtained:  Verbal   Consent given by:  Patient Indications:    Indications:  Pain relief Location:    Body area:  Upper extremity   Upper extremity nerve:  Metacarpal   Laterality:  Right Pre-procedure details:    Skin preparation:  Alcohol Skin anesthesia (see MAR for exact dosages):    Skin anesthesia method:  None Procedure details (see MAR for exact dosages):    Block needle gauge:  27 G   Anesthetic injected:  Lidocaine 2% w/o epi   Steroid injected:  None   Injection procedure:  Anatomic landmarks identified, anatomic landmarks palpated, introduced needle, incremental injection and negative aspiration for blood Post-procedure details:    Dressing:  None   Outcome:  Pain improved   Patient tolerance of procedure:  Tolerated well, no immediate complications   (including critical care time)  Medications Ordered in ED Medications  bupivacaine (MARCAINE) 0.25 % (with pres) injection 10 mL (has no administration in time range)  lidocaine (XYLOCAINE) 2 % (with pres) injection 200 mg (200 mg Infiltration Given by Other 05/24/17 1320)  fentaNYL (SUBLIMAZE)  injection 50 mcg (50 mcg Intravenous Given 05/24/17 1320)     Initial Impression / Assessment and Plan / ED Course  I have reviewed the triage vital signs and the nursing notes.  Pertinent imaging results that were available during my care of the patient were reviewed by me and considered in my medical decision making (see chart for details).     Near-complete amputation of R 5th finger at DIP joint. Performed digital nerve block and gave fentanyl for pain. Consulted hand team and discussed w/ Charma IgoMichael Jeffery, PA. Discussed w/ Dr. Amanda PeaGramig who took patient to OR for repair.   Final Clinical Impressions(s) / ED Diagnoses   Final diagnoses:  None    ED Discharge Orders    None  Little, Ambrose Finland, MD 05/24/17 [phone]

## 2017-05-24 NOTE — ED Notes (Signed)
Dr. Little at bedside.  

## 2017-05-24 NOTE — Consult Note (Signed)
Reason for Consult:Right little finger amputation Referring Physician: R Little  Justin LopesJonathan S Donovan is an 32 y.o. male.  HPI: [name] was trying to get something out of a tire at work with his pocket knife when the lock failed and the blade closed against his little and ring fingers. He came to the ED for evaluation. He suffered a near complete amputation of the distal phalanx of the little finger and a small laceration to the DIP joint of the ring finger. Hand surgery was consulted. 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  Past Medical History:  Diagnosis Date  . Anxiety   . Bipolar disorder (HCC)   . Depression     Past Surgical History:  Procedure Laterality Date  . CHOLECYSTECTOMY    . LITHOTRIPSY      No family history on file.  Social History:  reports that he has been smoking cigarettes.  He has a 15.00 pack-year smoking history. He has never used smokeless tobacco. He reports that he drank alcohol. He reports that he has current or past drug history. Drugs: Marijuana and Cocaine.  Allergies: No Known Allergies  Medications: I have reviewed the patient's current medications.  No results found for this or any previous visit (from the past 48 hour(s)).  Dg Finger Little Right  Result Date: 05/24/2017 CLINICAL DATA:  Little finger injury and pain EXAM: RIGHT LITTLE FINGER 2+V COMPARISON:  None. FINDINGS: Possible punctate fracture fragments adjacent to the fifth DIP joint with widening of the joint space. Overlying soft tissue laceration. IMPRESSION: Abnormal widening of the fifth DIP joint with possible punctate fracture fragments adjacent to the DIP joint. Electronically Signed   By: Jasmine PangKim  Fujinaga M.D.   On: 05/24/2017 14:08    Review of Systems  Constitutional: Negative for weight loss.  HENT: Negative for ear discharge, ear pain, hearing loss and tinnitus.   Eyes: Negative for blurred vision, double vision, photophobia and pain.  Respiratory: Negative for  cough, sputum production and shortness of breath.   Cardiovascular: Negative for chest pain.  Gastrointestinal: Negative for abdominal pain, nausea and vomiting.  Genitourinary: Negative for dysuria, flank pain, frequency and urgency.  Musculoskeletal: Positive for joint pain (Right little/ring fingers). Negative for back pain, falls, myalgias and neck pain.  Neurological: Negative for dizziness, tingling, sensory change, focal weakness, loss of consciousness and headaches.  Endo/Heme/Allergies: Does not bruise/bleed easily.  Psychiatric/Behavioral: Negative for depression, memory loss and substance abuse. The patient is not nervous/anxious.    Blood pressure 134/90, pulse 72, temperature 97.9 F (36.6 C), temperature source Oral, resp. rate (!) 22, height 6\' 1"  (1.854 m), weight 97.5 kg (215 lb), SpO2 (!) 87 %. Physical Exam  Constitutional: He appears well-developed and well-nourished. No distress.  HENT:  Head: Normocephalic and atraumatic.  Eyes: Conjunctivae are normal. Right eye exhibits no discharge. Left eye exhibits no discharge. No scleral icterus.  Neck: Normal range of motion.  Cardiovascular: Normal rate and regular rhythm.  Respiratory: Effort normal. No respiratory distress.  Musculoskeletal:  Right shoulder, elbow, wrist, digits- Near circumferential laceration at DIP joint of little finger, numb from block, dusky tip  Sens  Ax/R/M/U intact except blocked zones  Mot   Ax/ R/ PIN/ M/ AIN/ U intact  Rad 2+  Neurological: He is alert.  Skin: Skin is warm and dry. He is not diaphoretic.  Psychiatric: He has a normal mood and affect. His behavior is normal.    Assessment/Plan: Right little finger amputation -- Will need  to go to OR for amputation completion by Dr. Amanda Pea. NPO until then. Right ring finger laceration -- I don't think this needs repair but after a good scrubbing in OR will leave to MD judgment.    Freeman Caldron, PA-C Orthopedic  Surgery [phone] 05/24/2017, 2:17 PM

## 2017-05-24 NOTE — ED Triage Notes (Signed)
Onset 20 min PTA pt was using knife and cut right ring finger, right little finger.  Bleeding controlled. Little finger feels numb.

## 2017-05-24 NOTE — Anesthesia Postprocedure Evaluation (Signed)
Anesthesia Post Note  Patient: Wardell HonourJonathan S XXXCranford  Procedure(s) Performed: LITTLE FINGER REPAIR EXTENSOR AND FLEXOR TENDON, LATERAL LIGAMENT REPAIR, NERVE REPAIR, JOINT PINNING (Right Finger)     Patient location during evaluation: PACU Anesthesia Type: General Level of consciousness: awake and alert, oriented and patient cooperative Pain management: pain level controlled Vital Signs Assessment: post-procedure vital signs reviewed and stable Respiratory status: spontaneous breathing, nonlabored ventilation and respiratory function stable Cardiovascular status: blood pressure returned to baseline and stable Postop Assessment: no apparent nausea or vomiting Anesthetic complications: no    Last Vitals:  Vitals:   05/24/17 2105 05/24/17 2117  BP: (!) 157/73   Pulse:  77  Resp:  15  Temp:    SpO2:  97%    Last Pain:  Vitals:   05/24/17 2105  TempSrc:   PainSc: 0-No pain                 Orbin Mayeux,E. Gwenlyn Hottinger

## 2017-05-24 NOTE — Anesthesia Preprocedure Evaluation (Signed)
Anesthesia Evaluation  Patient identified by MRN, date of birth, ID band Patient awake    Reviewed: Allergy & Precautions, NPO status , Patient's Chart, lab work & pertinent test results  Airway Mallampati: II  TM Distance: >3 FB Neck ROM: Full    Dental  (+) Teeth Intact, Dental Advisory Given   Pulmonary Current Smoker,    breath sounds clear to auscultation       Cardiovascular  Rhythm:Regular Rate:Normal     Neuro/Psych    GI/Hepatic   Endo/Other    Renal/GU      Musculoskeletal   Abdominal   Peds  Hematology   Anesthesia Other Findings   Reproductive/Obstetrics                             Anesthesia Physical Anesthesia Plan  ASA: II  Anesthesia Plan: General   Post-op Pain Management:    Induction: Intravenous  PONV Risk Score and Plan: Ondansetron and Dexamethasone  Airway Management Planned: Oral ETT  Additional Equipment:   Intra-op Plan:   Post-operative Plan:   Informed Consent: I have reviewed the patients History and Physical, chart, labs and discussed the procedure including the risks, benefits and alternatives for the proposed anesthesia with the patient or authorized representative who has indicated his/her understanding and acceptance.   Dental advisory given  Plan Discussed with: CRNA and Anesthesiologist  Anesthesia Plan Comments:         Anesthesia Quick Evaluation

## 2017-05-24 NOTE — Anesthesia Procedure Notes (Signed)
Procedure Name: Intubation Date/Time: 05/24/2017 5:11 PM Performed by: Naresh Althaus T, CRNA Pre-anesthesia Checklist: Patient identified, Emergency Drugs available, Suction available and Patient being monitored Patient Re-evaluated:Patient Re-evaluated prior to induction Oxygen Delivery Method: Circle system utilized Preoxygenation: Pre-oxygenation with 100% oxygen Induction Type: IV induction Ventilation: Mask ventilation without difficulty Laryngoscope Size: Mac and 4 Grade View: Grade I Tube type: Oral Tube size: 7.5 mm Number of attempts: 1 Airway Equipment and Method: Patient positioned with wedge pillow and Stylet Placement Confirmation: ETT inserted through vocal cords under direct vision,  positive ETCO2 and breath sounds checked- equal and bilateral Secured at: 22 cm Tube secured with: Tape Dental Injury: Teeth and Oropharynx as per pre-operative assessment

## 2017-05-24 NOTE — ED Notes (Signed)
Called report to Short stay.  Per Dr.Gramig consent will be done in short stay.

## 2017-05-24 NOTE — Transfer of Care (Signed)
Immediate Anesthesia Transfer of Care Note  Patient: Justin LopesJonathan S Donovan  Procedure(s) Performed: LITTLE FINGER REPAIR EXTENSOR AND FLEXOR TENDON, LATERAL LIGAMENT REPAIR, NERVE REPAIR, JOINT PINNING (Right Finger)  Patient Location: PACU  Anesthesia Type:General  Level of Consciousness: awake, alert  and oriented  Airway & Oxygen Therapy: Patient Spontanous Breathing and Patient connected to nasal cannula oxygen  Post-op Assessment: Report given to RN, Post -op Vital signs reviewed and stable and Patient moving all extremities  Post vital signs: Reviewed and stable  Last Vitals:  Vitals Value Taken Time  BP 126/90 05/24/2017  6:35 PM  Temp 36.4 C 05/24/2017  6:34 PM  Pulse 86 05/24/2017  6:38 PM  Resp 27 05/24/2017  6:38 PM  SpO2 91 % 05/24/2017  6:38 PM  Vitals shown include unvalidated device data.  Last Pain:  Vitals:   05/24/17 1834  TempSrc:   PainSc: 0-No pain         Complications: No apparent anesthesia complications

## 2017-05-24 NOTE — Op Note (Signed)
See dictation# [id number]  Plan for IV ABX and obs  We performed reconstruction of the finger-we will need obs re the viability and declaration  Alnisa Hasley MD

## 2017-05-25 ENCOUNTER — Other Ambulatory Visit: Payer: Self-pay

## 2017-05-25 ENCOUNTER — Encounter (HOSPITAL_COMMUNITY): Payer: Self-pay

## 2017-05-25 MED ORDER — ADULT MULTIVITAMIN W/MINERALS CH
1.0000 | ORAL_TABLET | Freq: Every day | ORAL | Status: DC
  Administered 2017-05-25: 1 via ORAL
  Filled 2017-05-25: qty 1

## 2017-05-25 MED ORDER — LACTATED RINGERS IV SOLN: INTRAVENOUS | Status: DC

## 2017-05-25 MED ORDER — TRAZODONE HCL 50 MG PO TABS: 150.0000 mg | ORAL_TABLET | Freq: Every evening | ORAL | Status: DC | PRN

## 2017-05-25 MED ORDER — FAMOTIDINE 20 MG PO TABS: 20.0000 mg | ORAL_TABLET | Freq: Two times a day (BID) | ORAL | Status: DC | PRN

## 2017-05-25 MED ORDER — VITAMIN C 500 MG PO TABS
1000.0000 mg | ORAL_TABLET | Freq: Every day | ORAL | Status: DC
  Administered 2017-05-25: 1000 mg via ORAL
  Filled 2017-05-25: qty 2

## 2017-05-25 MED ORDER — ONDANSETRON 8 MG PO TBDP: 8.0000 mg | ORAL_TABLET | Freq: Three times a day (TID) | ORAL | Status: DC | PRN

## 2017-05-25 MED ORDER — ONDANSETRON HCL 4 MG PO TABS: 4.0000 mg | ORAL_TABLET | Freq: Every day | ORAL | Status: DC | PRN

## 2017-05-25 MED ORDER — ASPIRIN 325 MG PO TABS
325.0000 mg | ORAL_TABLET | Freq: Two times a day (BID) | ORAL | Status: DC
  Administered 2017-05-25: 325 mg via ORAL
  Filled 2017-05-25: qty 1

## 2017-05-25 MED ORDER — PROMETHAZINE HCL 12.5 MG RE SUPP
12.5000 mg | Freq: Four times a day (QID) | RECTAL | Status: DC | PRN
  Filled 2017-05-25: qty 1

## 2017-05-25 NOTE — Discharge Summary (Signed)
Physician Discharge Summary  Patient ID: [name] MRN: [id number] DOB/AGE: 04-09-1985 32 y.o.  Admit date: 05/24/2017 Discharge date:   Admission Diagnoses: traumatic amputation Past Medical History:  Diagnosis Date  . Anxiety   . Bipolar disorder (HCC)   . Depression     Discharge Diagnoses:  Active Problems:   Status post surgery   Laceration of finger, right   Surgeries: Procedure(s): LITTLE FINGER REPAIR EXTENSOR AND FLEXOR TENDON, LATERAL LIGAMENT REPAIR, NERVE REPAIR, JOINT PINNING on 05/24/2017    Consultants: Treatment Team:  Justin Severin, MD  Discharged Condition: Improved  Hospital Course: [name] is an 32 y.o. male who was admitted 05/24/2017 with a chief complaint of  Chief Complaint  Patient presents with  . Laceration  , and found to have a diagnosis of traumatic amputation.  They were brought to the operating room on 05/24/2017 and underwent Procedure(s): LITTLE FINGER REPAIR EXTENSOR AND FLEXOR TENDON, LATERAL LIGAMENT REPAIR, NERVE REPAIR, JOINT PINNING.    They were given perioperative antibiotics:  Anti-infectives (From admission, onward)   Start     Dose/Rate Route Frequency Ordered Stop   05/25/17 0600  ceFAZolin (ANCEF) IVPB 2g/100 mL premix     2 g 200 mL/hr over 30 Minutes Intravenous On call to O.R. 05/24/17 1558 05/24/17 1703   05/25/17 0200  ceFAZolin (ANCEF) IVPB 1 g/50 mL premix     1 g 100 mL/hr over 30 Minutes Intravenous Every 8 hours 05/24/17 1942     05/24/17 1945  ceFAZolin (ANCEF) IVPB 1 g/50 mL premix  Status:  Discontinued     1 g 100 mL/hr over 30 Minutes Intravenous NOW 05/24/17 1942 05/24/17 2029   05/24/17 1604  ceFAZolin (ANCEF) 2-4 GM/100ML-% IVPB    Note to Pharmacy:  Sandi Raveling   : cabinet override      05/24/17 1604 05/24/17 1703    .  They were given sequential compression devices, early ambulation, and Other (comment) for DVT prophylaxis.  Recent vital signs:  Patient Vitals  for the past 24 hrs:  BP Temp Temp src Pulse Resp SpO2 Height Weight  05/24/17 2357 108/63 98.1 F (36.7 C) Oral 66 17 96 % - -  05/24/17 2330 105/62 98.3 F (36.8 C) - (!) 14 16 97 % - -  05/24/17 2117 - - - 77 15 97 % - -  05/24/17 2105 (!) 157/73 - - - - - - -  05/24/17 2005 - 97.8 F (36.6 C) - - - - - -  05/24/17 1905 123/78 - - 75 16 94 % - -  05/24/17 1850 132/85 - - 78 15 (!) 88 % - -  05/24/17 1835 126/90 - - 89 (!) 21 93 % - -  05/24/17 1834 - (!) 97.5 F (36.4 C) - - - - - -  05/24/17 1500 (!) 130/93 - - - - - - -  05/24/17 1430 (!) 129/92 - - 67 - 96 % - -  05/24/17 1400 (!) 132/95 - - 69 - 95 % - -  05/24/17 1330 134/90 - - 72 - (!) 87 % - -  05/24/17 1300 - - - - - - 6\' 1"  (1.854 m) 97.5 kg (215 lb)  05/24/17 1259 109/86 97.9 F (36.6 C) Oral - (!) 22 100 % - -  .  Recent laboratory studies: Dg Finger Little Right  Result Date: 05/24/2017 CLINICAL DATA:  Little finger injury and pain EXAM: RIGHT LITTLE FINGER 2+V COMPARISON:  None. FINDINGS:  Possible punctate fracture fragments adjacent to the fifth DIP joint with widening of the joint space. Overlying soft tissue laceration. IMPRESSION: Abnormal widening of the fifth DIP joint with possible punctate fracture fragments adjacent to the DIP joint. Electronically Signed   By: Jasmine Pang M.D.   On: 05/24/2017 14:08    Discharge Medications:   Allergies as of 05/25/2017   No Known Allergies     Medication List    STOP taking these medications   brompheniramine-pseudoephedrine-DM 30-2-10 MG/5ML syrup   cyclobenzaprine 10 MG tablet Commonly known as:  FLEXERIL   naproxen 500 MG tablet Commonly known as:  NAPROSYN   naproxen sodium 220 MG tablet Commonly known as:  ALEVE     TAKE these medications   acetaminophen 325 MG tablet Commonly known as:  TYLENOL Take 650 mg by mouth every 6 (six) hours as needed for mild pain.   ibuprofen 200 MG tablet Commonly known as:  ADVIL,MOTRIN Take 200 mg by mouth every  6 (six) hours as needed for moderate pain.   multivitamin tablet Take 1 tablet by mouth daily.   ondansetron 4 MG tablet Commonly known as:  ZOFRAN Take 1 tablet (4 mg total) by mouth daily as needed for nausea or vomiting.   ondansetron 8 MG disintegrating tablet Commonly known as:  ZOFRAN ODT Take 1 tablet (8 mg total) by mouth every 8 (eight) hours as needed for nausea or vomiting.   traMADol 50 MG tablet Commonly known as:  ULTRAM Take 1 tablet (50 mg total) by mouth every 12 (twelve) hours as needed.   traZODone 150 MG tablet Commonly known as:  DESYREL Take 1 tablet (150 mg total) by mouth at bedtime as needed for sleep.       Diagnostic Studies: Dg Finger Little Right  Result Date: 05/24/2017 CLINICAL DATA:  Little finger injury and pain EXAM: RIGHT LITTLE FINGER 2+V COMPARISON:  None. FINDINGS: Possible punctate fracture fragments adjacent to the fifth DIP joint with widening of the joint space. Overlying soft tissue laceration. IMPRESSION: Abnormal widening of the fifth DIP joint with possible punctate fracture fragments adjacent to the DIP joint. Electronically Signed   By: Jasmine Pang M.D.   On: 05/24/2017 14:08    They benefited maximally from their hospital stay and there were no complications.     Disposition: Discharge disposition: 01-Home or Self Care      Discharge Instructions    Call MD / Call 911   Complete by:  As directed    If you experience chest pain or shortness of breath, CALL 911 and be transported to the hospital emergency room.  If you develope a fever above 101 F, pus (white drainage) or increased drainage or redness at the wound, or calf pain, call your surgeon's office.   Constipation Prevention   Complete by:  As directed    Drink plenty of fluids.  Prune juice may be helpful.  You may use a stool softener, such as Colace (over the counter) 100 mg twice a day.  Use MiraLax (over the counter) for constipation as needed.   Diet - low  sodium heart healthy   Complete by:  As directed    Increase activity slowly as tolerated   Complete by:  As directed      Follow-up Information    Justin Severin, MD Follow up in 10 day(s).   Specialty:  Orthopedic Surgery Why:  We will call for your follow-up visit Contact information: [address] [place] [address] [phone]          Patient looks quite well postop day 1.  This was a reattachment of the small finger.  He has good refill no evidence of complications.  I discussed with him all issues.  We repaired his extensor and flexor tendon as well as the collateral ligament and the ulnar digital nerve with pinning of his joint.  I will discharge him home on Bactrim DS 1 p.o. twice daily for 14 days.  OxyIR as needed pain was written for dispense #40-1 tablet every 4-6 hours.  He will also be given Robaxin 500 mg 1 p.o. every 6 hours as needed spasm.  I have asked him to take vitamin C at thousand milligrams a day and to take ASA aspirin 325 mg 1 p.o. twice daily.  I once again recommended smoking cessation.  We will see him in 10 days in the office for a check.  Refill is key in terms of viability.  I rewrapped his bandage which was somewhat in disarray today.  I discussed him he has to be very careful with this.  He is tolerating a regular diet.  He has no signs of DVT infection or complication.  We will see him back in 10 days in my office will call him for the appointment  Final diagnosis status post reconstruction right small finger tendon nerve bone and associated soft tissue secondary to lacerating injury.  Tenecia Ignasiak MD   Signed: Dionne AnoWilliam M Dabney Schanz III 05/25/2017, 6:34 AM

## 2017-05-25 NOTE — Discharge Instructions (Signed)
Keep bandage clean and dry.  Call for any problems.  No smoking.  Criteria for driving a car: you should be off your pain medicine for 7-8 hours, able to drive one handed(confident), thinking clearly and feeling able in your judgement to drive. Continue elevation as it will decrease swelling.  If instructed by MD move your fingers within the confines of the bandage/splint.     Call immediately for any sudden loss of feeling in your hand/arm or change in functional abilities of the extremity.We recommend that you to take vitamin C 1000 mg a day to promote healing. We also recommend that if you require  pain medicine that you take a stool softener to prevent constipation as most pain medicines will have constipation side effects. We recommend either Peri-Colace or Senokot and recommend that you also consider adding MiraLAX as well to prevent the constipation affects from pain medicine if you are required to use them. These medicines are over the counter and may be purchased at a local pharmacy. A cup of yogurt and a probiotic can also be helpful during the recovery process as the medicines can disrupt your intestinal environment.     You should take a 325 mg aspirin twice a day   Dr. Carlos LeveringGramig's office will call for your follow-up.    You have been discharged on Bactrim for 14 days which is an antibiotic, oxycodone as needed pain 1 to 2 tablets every 4 hours as needed for pain and Robaxin for spasm.  He also wants you to be very careful in your bandage and not get this dirty.

## 2017-05-25 NOTE — Op Note (Signed)
NAME:  [name], [name] NO.:  MEDICAL RECORD NO.:  [id number]  LOCATION:                                 FACILITY:  PHYSICIAN:  Dionne Ano. Sreenidhi Ganson, M.D.     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  PROCEDURES: 1. Irrigation and debridement of skin, subcutaneous tissue, joint,     bone, tendon, and associated soft tissues, right small finger.     This was an excisional debridement with curette, knife, blade, and     scissor. 2. Flexor digitorum profundus repair, zone 1, right small finger. 3. Extensor tendon repair, zone 1, right small finger. 4. Ulnar collateral ligament repair, direct primary in nature, right     small finger. 5. Pinning, open distal interphalangeal joint with associated distal     interphalangeal small volar chip fracture with 0.035 Kirschner     wire. 6. AP, lateral, and oblique x-ray was performed, examined and     interpreted by myself, right small finger. 7. Ulnar digital nerve repair, right small finger. 8. 1 cm deep laceration irrigation and debridement of skin,     subcutaneous tissue, and closure about the ring finger, dorsoulnar     aspect.  SURGEON:  Dionne Ano. Amanda Pea, M.D.  ASSISTANT:  None.  COMPLICATIONS:  None.  ANESTHESIA:  General.  TOURNIQUET TIME:  Zero.  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  He has lacerations to the ring finger and small finger.  The small finger has near-complete amputation at the DIP joint.  He does have a radial strip of tissue. Fortunately, in the operative theater, there was an intact vascular intensity in the form of his radial digital artery and associated venous structure as it appeared.  The patient and I discussed options of amputation versus reconstruction.  He understands our plans.  OPERATION IN DETAIL:  The patient was seen by myself and Anesthesia, taken to the operative theater and underwent smooth induction of general anesthetic.  I gave him 3 separate Hibiclens scrubs followed by 10 minutes of surgical Betadine scrub and paint.  Once this was complete, time-out was observed and I performed irrigation and debridement of an open DIP joint about the small finger as well as laceration about the ring finger.  The laceration about the ring finger was stable.  There was no advanced extensor tendon injury in the central portion of the tendon and he had fairly good splay.  The small finger had flexor and extensor lacerations complete at the DIP level as well as ulnar collateral ligament and ulnar digital nerve injury.  The radial digital nerve and artery appeared to be intact  and had viability to the tip of his finger, although it was somewhat cooler than the others.  At this time, I performed irrigation and debridement of skin, subcutaneous tissue, bone, and associated soft tissue structures with curette, scissor, and knife blade.  I then placed 3 L of saline through and through the wounds without difficulty.  I should note that we really took a lot of time to perform a proper I and D and prevent infection. If this is to go on to viability, we need to prevent infection.  Thus, we were meticulous in our handling of the tissues and repair technique. At this time, I assessed the viability multiple times and noted refill to the tip of the  finger.  I felt that he was not excessively venous congested and felt that he did have good inflow and some degree of outflow given the strip of tissue.  Thus, we chose to reconstruct the areas.  I pre-placed extensor tendon sutures proximally and distally as well as flexor tendon sutures proximally and distally in zone 1.  This was done with FiberWire.  I then pre-placed a suture in the ulnar collateral ligament and the ulnar digital nerve.  At this time, I then pinned the joint in slight flexion after I tied down the flexor digitorum profundus repair in zone 1.  The FDP repair in zone 1 went without difficulty and there were no complicating features.  Once the tendon was repaired, I then placed a 0.035 K-wire from distal to proximal across the DIP joint, backed it out, then prebent it and seated against the bone.  This was an open reduction of the DIP joint and stabilization with 0.035 K-wire.  X-rays were performed, examined, and interpreted by myself, and looked to be excellent.  Following this, the extensor tendon and the ulna collateral ligament sutures were tied down.  Thus, pinning of an open fracture at the DIP joint was accomplished. The main portion of the DIP joint had good cartilage.  In addition to this, flexor digitorum profundus repair in zone 1, extensor digitorum/terminal extensor tendon in zone 1 was repaired at the DIP level, and the ulnar collateral ligament was repaired as well.  The radial digital nerve and radial digital arteries as well as radial collateral ligament were stable and intact.  I then repaired the ulnar digital nerve with 8-0 nylon under 4.5 loupe magnification with an epineural repair technique.  Once this was complete, I once again assessed the viability, it did look good, it had good refill, it did not appear to be venous congested. Nevertheless, I do have concerns going forward given the fact he is a smoker and he had a tremendous amount of  injury.  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  At this time, we then repaired the ring finger laceration.  This was less than 1 cm deep laceration repair with suture.  Once this was complete, I then gave him an additional period of observation, the finger looked well and did not change in terms of his characteristic blood flow to the tip with less than 2-second capillary refill.  It was a small bit cooler than the adjacent ring finger, but as noted above, I wanted to try to give this an opportunity to remain viable to give him a better power grip into the future.  We thus at  this time dressed him with Mepitel, Xeroform, and a bulky soft splint, keeping the index and middle fingers and thumb free.  The ring finger and small finger were included in the splint.  There were no complicating features.  The patient will be admitted overnight for IV antibiotics, general postop observation, and pain control.  In addition to this, we will plan for careful cautious observation in the postoperative period.  Going forward, I do fear some degree of cold intolerance, but nevertheless, I think we have to wait and see how this plays out. Refill will be an issue if he becomes too venous congested and declares himself nonviable, then certainly other option would have to be considered.  For now, we are hopeful and watchful.  All questions have been encouraged and answered.     Dionne Ano. Amanda Pea, M.D.     [facility]  D:  05/24/2017  T:  05/24/2017  Job:  [id number]

## 2017-05-25 NOTE — Progress Notes (Signed)
Patient ID: Justin HonourJonathan S Donovan, male   DOB: 08/31/1985, 32 y.o.   MRN: [id number] Patient looks quite well postop day 1.  This was a reattachment of the small finger.  He has good refill no evidence of complications.  I discussed with him all issues.  We repaired his extensor and flexor tendon as well as the collateral ligament and the ulnar digital nerve with pinning of his joint.  I will discharge him home on Bactrim DS 1 p.o. twice daily for 14 days.  OxyIR as needed pain was written for dispense #40-1 tablet every 4-6 hours.  He will also be given Robaxin 500 mg 1 p.o. every 6 hours as needed spasm.  I have asked him to take vitamin C at thousand milligrams a day and to take ASA aspirin 325 mg 1 p.o. twice daily.  I once again recommended smoking cessation.  We will see him in 10 days in the office for a check.  Refill is key in terms of viability.  I rewrapped his bandage which was somewhat in disarray today.  I discussed him he has to be very careful with this.  He is tolerating a regular diet.  He has no signs of DVT infection or complication.  We will see him back in 10 days in my office will call him for the appointment  Final diagnosis status post reconstruction right small finger tendon nerve bone and associated soft tissue secondary to lacerating injury.  Hussam Muniz MD

## 2017-07-04 ENCOUNTER — Other Ambulatory Visit: Payer: Self-pay | Admitting: Orthopedic Surgery

## 2017-07-04 ENCOUNTER — Ambulatory Visit: Payer: Self-pay | Admitting: Orthopedic Surgery

## 2017-07-11 ENCOUNTER — Encounter (HOSPITAL_COMMUNITY): Payer: Self-pay | Admitting: *Deleted

## 2017-07-11 ENCOUNTER — Other Ambulatory Visit: Payer: Self-pay

## 2017-07-11 NOTE — Progress Notes (Signed)
I spoke with patient and instructed him no soild food after 8:00AM- no greasy foods. Clear liquids until 10 AM.  I discussed with patient what clear liquids are: coffee no cream, team no cream, sodas- not cream soda,  Juice : cranberry, apple , grape, clear Gatorade, clear pop cycles- not fruit bits, plain jello.  Patient voiced understanding.

## 2017-07-12 ENCOUNTER — Other Ambulatory Visit: Payer: Self-pay

## 2017-07-12 ENCOUNTER — Ambulatory Visit (HOSPITAL_COMMUNITY)
Admission: RE | Admit: 2017-07-12 | Discharge: 2017-07-12 | Disposition: A | Discharge status: Home / Self Care | Payer: Self-pay | Source: Ambulatory Visit | Attending: Orthopedic Surgery | Admitting: Orthopedic Surgery

## 2017-07-12 ENCOUNTER — Encounter (HOSPITAL_COMMUNITY): Payer: Self-pay | Admitting: Surgery

## 2017-07-12 ENCOUNTER — Ambulatory Visit (HOSPITAL_COMMUNITY): Payer: Self-pay | Admitting: Anesthesiology

## 2017-07-12 ENCOUNTER — Encounter (HOSPITAL_COMMUNITY)
Admission: RE | Disposition: A | Discharge status: Home / Self Care | Payer: Self-pay | Source: Ambulatory Visit | Attending: Orthopedic Surgery

## 2017-07-12 DIAGNOSIS — F319 Bipolar disorder, unspecified: Secondary | ICD-10-CM | POA: Insufficient documentation

## 2017-07-12 DIAGNOSIS — F419 Anxiety disorder, unspecified: Secondary | ICD-10-CM | POA: Insufficient documentation

## 2017-07-12 DIAGNOSIS — Z9114 Patient's other noncompliance with medication regimen: Secondary | ICD-10-CM | POA: Insufficient documentation

## 2017-07-12 DIAGNOSIS — Z472 Encounter for removal of internal fixation device: Secondary | ICD-10-CM | POA: Insufficient documentation

## 2017-07-12 DIAGNOSIS — F17210 Nicotine dependence, cigarettes, uncomplicated: Secondary | ICD-10-CM | POA: Insufficient documentation

## 2017-07-12 HISTORY — DX: Unspecified asthma, uncomplicated: J45.909

## 2017-07-12 HISTORY — DX: Personal history of urinary calculi: Z87.442

## 2017-07-12 HISTORY — DX: Concussion with loss of consciousness of unspecified duration, initial encounter: S06.0X9A

## 2017-07-12 HISTORY — PX: HARDWARE REMOVAL: SHX979

## 2017-07-12 HISTORY — DX: Other complications of anesthesia, initial encounter: T88.59XA

## 2017-07-12 HISTORY — DX: Concussion with loss of consciousness status unknown, initial encounter: S06.0XAA

## 2017-07-12 HISTORY — DX: Adverse effect of unspecified anesthetic, initial encounter: T41.45XA

## 2017-07-12 LAB — COMPREHENSIVE METABOLIC PANEL
ALT: 16 U/L — ABNORMAL LOW (ref 17–63)
AST: 27 U/L (ref 15–41)
Albumin: 3.3 g/dL — ABNORMAL LOW (ref 3.5–5.0)
Alkaline Phosphatase: 53 U/L (ref 38–126)
Anion gap: 7 (ref 5–15)
BUN: 10 mg/dL (ref 6–20)
CO2: 28 mmol/L (ref 22–32)
Calcium: 8.7 mg/dL — ABNORMAL LOW (ref 8.9–10.3)
Chloride: 106 mmol/L (ref 101–111)
Creatinine, Ser: 1.03 mg/dL (ref 0.61–1.24)
GFR calc Af Amer: >60 mL/min (ref >60)
GFR calc non Af Amer: >60 mL/min (ref >60)
Glucose, Bld: 80 mg/dL (ref 65–99)
Potassium: 4.3 mmol/L (ref 3.5–5.1)
Sodium: 141 mmol/L (ref 135–145)
Total Bilirubin: 0.9 mg/dL (ref 0.3–1.2)
Total Protein: 5.6 g/dL — ABNORMAL LOW (ref 6.5–8.1)

## 2017-07-12 LAB — HEMOGLOBIN: HEMOGLOBIN: 14.5 g/dL (ref 13.0–17.0)

## 2017-07-12 SURGERY — REMOVAL, HARDWARE
Anesthesia: General | Site: Finger | Laterality: Right

## 2017-07-12 MED ORDER — CHLORHEXIDINE GLUCONATE 4 % EX LIQD: 60.0000 mL | Freq: Once | CUTANEOUS | Status: DC

## 2017-07-12 MED ORDER — HYDROMORPHONE HCL 2 MG/ML IJ SOLN
INTRAMUSCULAR | Status: AC
  Filled 2017-07-12: qty 1

## 2017-07-12 MED ORDER — LIDOCAINE HCL (CARDIAC) PF 100 MG/5ML IV SOSY
PREFILLED_SYRINGE | INTRAVENOUS | Status: DC | PRN
  Administered 2017-07-12: 100 mg via INTRATRACHEAL

## 2017-07-12 MED ORDER — BUPIVACAINE HCL (PF) 0.25 % IJ SOLN
INTRAMUSCULAR | Status: AC
  Filled 2017-07-12: qty 30

## 2017-07-12 MED ORDER — POVIDONE-IODINE 10 % EX SWAB: 2.0000 "application " | Freq: Once | CUTANEOUS | Status: DC

## 2017-07-12 MED ORDER — FENTANYL CITRATE (PF) 250 MCG/5ML IJ SOLN
INTRAMUSCULAR | Status: AC
  Filled 2017-07-12: qty 5

## 2017-07-12 MED ORDER — PROPOFOL 10 MG/ML IV BOLUS
INTRAVENOUS | Status: AC
  Filled 2017-07-12: qty 20

## 2017-07-12 MED ORDER — ONDANSETRON HCL 4 MG/2ML IJ SOLN
INTRAMUSCULAR | Status: DC | PRN
  Administered 2017-07-12: 4 mg via INTRAVENOUS

## 2017-07-12 MED ORDER — MIDAZOLAM HCL 2 MG/2ML IJ SOLN
INTRAMUSCULAR | Status: DC | PRN
  Administered 2017-07-12: 2 mg via INTRAVENOUS

## 2017-07-12 MED ORDER — CEFAZOLIN SODIUM-DEXTROSE 2-4 GM/100ML-% IV SOLN
2.0000 g | INTRAVENOUS | Status: AC
  Administered 2017-07-12: 2 g via INTRAVENOUS
  Filled 2017-07-12: qty 100

## 2017-07-12 MED ORDER — HYDROMORPHONE HCL 2 MG/ML IJ SOLN
0.2500 mg | INTRAMUSCULAR | Status: DC | PRN
  Administered 2017-07-12 (×4): 0.5 mg via INTRAVENOUS

## 2017-07-12 MED ORDER — PROPOFOL 10 MG/ML IV BOLUS
INTRAVENOUS | Status: DC | PRN
  Administered 2017-07-12: 50 mg via INTRAVENOUS
  Administered 2017-07-12: 200 mg via INTRAVENOUS

## 2017-07-12 MED ORDER — ONDANSETRON HCL 4 MG/2ML IJ SOLN
INTRAMUSCULAR | Status: AC
  Filled 2017-07-12: qty 2

## 2017-07-12 MED ORDER — LACTATED RINGERS IV SOLN
INTRAVENOUS | Status: DC
  Administered 2017-07-12: 20:00:00 via INTRAVENOUS

## 2017-07-12 MED ORDER — ONDANSETRON HCL 4 MG/2ML IJ SOLN: INTRAMUSCULAR | Status: DC | PRN

## 2017-07-12 MED ORDER — MIDAZOLAM HCL 2 MG/2ML IJ SOLN
INTRAMUSCULAR | Status: AC
  Filled 2017-07-12: qty 2

## 2017-07-12 MED ORDER — 0.9 % SODIUM CHLORIDE (POUR BTL) OPTIME
TOPICAL | Status: DC | PRN
  Administered 2017-07-12: 1000 mL

## 2017-07-12 MED ORDER — FENTANYL CITRATE (PF) 250 MCG/5ML IJ SOLN
INTRAMUSCULAR | Status: DC | PRN
  Administered 2017-07-12 (×2): 50 ug via INTRAVENOUS

## 2017-07-12 MED ORDER — DEXAMETHASONE SODIUM PHOSPHATE 10 MG/ML IJ SOLN
INTRAMUSCULAR | Status: AC
  Filled 2017-07-12: qty 1

## 2017-07-12 MED ORDER — DEXAMETHASONE SODIUM PHOSPHATE 10 MG/ML IJ SOLN
INTRAMUSCULAR | Status: DC | PRN
  Administered 2017-07-12: 10 mg via INTRAVENOUS

## 2017-07-12 MED ORDER — LIDOCAINE 2% (20 MG/ML) 5 ML SYRINGE
INTRAMUSCULAR | Status: AC
  Filled 2017-07-12: qty 5

## 2017-07-12 SURGICAL SUPPLY — 51 items
BANDAGE ACE 3X5.8 VEL STRL LF (GAUZE/BANDAGES/DRESSINGS) IMPLANT
BANDAGE ACE 4X5 VEL STRL LF (GAUZE/BANDAGES/DRESSINGS) IMPLANT
BNDG COHESIVE 1X5 TAN STRL LF (GAUZE/BANDAGES/DRESSINGS) ×2 IMPLANT
BNDG CONFORM 2 STRL LF (GAUZE/BANDAGES/DRESSINGS) ×2 IMPLANT
BNDG GAUZE ELAST 4 BULKY (GAUZE/BANDAGES/DRESSINGS) ×4 IMPLANT
CAP PIN ORTHO PINK (CAP) IMPLANT
CAP PIN PROTECTOR ORTHO WHT (CAP) IMPLANT
CORDS BIPOLAR (ELECTRODE) ×2 IMPLANT
COVER SURGICAL LIGHT HANDLE (MISCELLANEOUS) ×2 IMPLANT
CUFF TOURNIQUET SINGLE 18IN (TOURNIQUET CUFF) ×2 IMPLANT
CUFF TOURNIQUET SINGLE 24IN (TOURNIQUET CUFF) IMPLANT
DRAPE OEC MINIVIEW 54X84 (DRAPES) IMPLANT
DRAPE SURG 17X23 STRL (DRAPES) ×2 IMPLANT
GAUZE SPONGE 2X2 8PLY STRL LF (GAUZE/BANDAGES/DRESSINGS) ×1 IMPLANT
GAUZE SPONGE 4X4 12PLY STRL (GAUZE/BANDAGES/DRESSINGS) IMPLANT
GAUZE XEROFORM 1X8 LF (GAUZE/BANDAGES/DRESSINGS) ×2 IMPLANT
GLOVE BIOGEL M 8.0 STRL (GLOVE) IMPLANT
GLOVE SS BIOGEL STRL SZ 8 (GLOVE) ×2 IMPLANT
GLOVE SUPERSENSE BIOGEL SZ 8 (GLOVE) ×2
GOWN STRL REUS W/ TWL LRG LVL3 (GOWN DISPOSABLE) ×1 IMPLANT
GOWN STRL REUS W/ TWL XL LVL3 (GOWN DISPOSABLE) ×1 IMPLANT
GOWN STRL REUS W/TWL LRG LVL3 (GOWN DISPOSABLE) ×1
GOWN STRL REUS W/TWL XL LVL3 (GOWN DISPOSABLE) ×1
K-WIRE SMTH SNGL TROCAR .028X4 (WIRE)
KIT BASIN OR (CUSTOM PROCEDURE TRAY) ×2 IMPLANT
KIT TURNOVER KIT B (KITS) ×2 IMPLANT
KWIRE SMTH SNGL TROCAR .028X4 (WIRE) IMPLANT
MANIFOLD NEPTUNE II (INSTRUMENTS) IMPLANT
NEEDLE HYPO 25GX1X1/2 BEV (NEEDLE) IMPLANT
NS IRRIG 1000ML POUR BTL (IV SOLUTION) ×2 IMPLANT
PACK ORTHO EXTREMITY (CUSTOM PROCEDURE TRAY) ×2 IMPLANT
PAD ARMBOARD 7.5X6 YLW CONV (MISCELLANEOUS) ×4 IMPLANT
PAD CAST 4YDX4 CTTN HI CHSV (CAST SUPPLIES) IMPLANT
PADDING CAST COTTON 4X4 STRL (CAST SUPPLIES)
SCRUB BETADINE 4OZ XXX (MISCELLANEOUS) ×2 IMPLANT
SOL PREP POV-IOD 4OZ 10% (MISCELLANEOUS) ×2 IMPLANT
SPLINT FINGER (SOFTGOODS) ×2 IMPLANT
SPONGE GAUZE 2X2 STER 10/PKG (GAUZE/BANDAGES/DRESSINGS) ×1
SUCTION FRAZIER HANDLE 10FR (MISCELLANEOUS)
SUCTION TUBE FRAZIER 10FR DISP (MISCELLANEOUS) IMPLANT
SUT CHROMIC 5 0 P 3 (SUTURE) ×2 IMPLANT
SUT MERSILENE 4 0 P 3 (SUTURE) IMPLANT
SUT PROLENE 4 0 PS 2 18 (SUTURE) IMPLANT
SUT VIC AB 2-0 CT1 27 (SUTURE)
SUT VIC AB 2-0 CT1 TAPERPNT 27 (SUTURE) IMPLANT
SYR CONTROL 10ML LL (SYRINGE) IMPLANT
TOWEL OR 17X24 6PK STRL BLUE (TOWEL DISPOSABLE) ×2 IMPLANT
TOWEL OR 17X26 10 PK STRL BLUE (TOWEL DISPOSABLE) ×2 IMPLANT
TUBE CONNECTING 12X1/4 (SUCTIONS) IMPLANT
UNDERPAD 30X30 (UNDERPADS AND DIAPERS) ×2 IMPLANT
WATER STERILE IRR 1000ML POUR (IV SOLUTION) IMPLANT

## 2017-07-12 NOTE — Transfer of Care (Signed)
Immediate Anesthesia Transfer of Care Note  Patient: Justin LopesJonathan S Pouliot  Procedure(s) Performed: Right small finger hardware removal, pin removal (Right Finger)  Patient Location: PACU  Anesthesia Type:General  Level of Consciousness: awake, alert , oriented, sedated and patient cooperative  Airway & Oxygen Therapy: Patient Spontanous Breathing and Patient connected to nasal cannula oxygen  Post-op Assessment: Report given to RN, Post -op Vital signs reviewed and stable and Patient moving all extremities X 4  Post vital signs: Reviewed and stable  Last Vitals:  Vitals Value Taken Time  BP 124/96 07/12/2017  8:52 PM  Temp 36.3 C 07/12/2017  8:52 PM  Pulse 60 07/12/2017  8:52 PM  Resp 17 07/12/2017  8:52 PM  SpO2 100 % 07/12/2017  8:52 PM  Vitals shown include unvalidated device data.  Last Pain:  Vitals:   07/12/17 1502  TempSrc:   PainSc: 1       Patients Stated Pain Goal: 3 (07/12/17 1502)  Complications: No apparent anesthesia complications

## 2017-07-12 NOTE — Op Note (Signed)
Operative note  July 12, 2017  Preoperative diagnosis: Retained hardware right small finger Postop diagnosis: Same  Operative procedure: Deep hardware removal right small finger  Surgeon Dominica SeverinWilliam Maverick Patman  Tourniquet time 0  Estimated blood loss minimal  Indications: This patient is a pleasant gentleman who is undergone reconstruction of his finger and attempt to salvage his finger.  He now presents for hardware removal as a stage portion of his initial reconstructive attempts.  He understands risks and benefits and desires to proceed.   Operative procedure patient was taken to the operative theater.  He underwent a general anesthetic.  Timeout was observed.  He was prepped and draped with 2 Hibiclens scrubs followed by attainment surgical Betadine scrub and paint.  Following this he underwent incision about the finger after timeout.  Dissection was carried down to the periosteal tissue and a long Kirschner wire was removed which transfixed the DIP joint.  Following this manipulation under anesthesia showed good stability of the finger.  He underwent irrigation followed by wound closure with chromic.  Dressing of Xeroform 4 x 4's and a finger splint was accomplished.  He tolerated this well.  We will plan for elevation and return to clinic in 1/2 to 2 weeks.  Patient will begin some gentle interval motion at that time with fingertip protection.  Norco as needed pain  Almadelia Looman MD

## 2017-07-12 NOTE — Anesthesia Preprocedure Evaluation (Signed)
Anesthesia Evaluation  Patient identified by MRN, date of birth, ID band Patient awake    Reviewed: Allergy & Precautions, H&P , NPO status , Patient's Chart, lab work & pertinent test results  Airway Mallampati: II  TM Distance: >3 FB Neck ROM: Full    Dental no notable dental hx. (+) Teeth Intact, Dental Advisory Given   Pulmonary Current Smoker,    Pulmonary exam normal breath sounds clear to auscultation       Cardiovascular negative cardio ROS   Rhythm:Regular Rate:Normal     Neuro/Psych Anxiety Depression Bipolar Disorder negative neurological ROS     GI/Hepatic negative GI ROS, Neg liver ROS,   Endo/Other  negative endocrine ROS  Renal/GU negative Renal ROS  negative genitourinary   Musculoskeletal   Abdominal   Peds  Hematology negative hematology ROS (+)   Anesthesia Other Findings   Reproductive/Obstetrics negative OB ROS                             Anesthesia Physical Anesthesia Plan  ASA: II  Anesthesia Plan: General   Post-op Pain Management:    Induction: Intravenous  PONV Risk Score and Plan: 2 and Ondansetron, Dexamethasone and Midazolam  Airway Management Planned: LMA  Additional Equipment:   Intra-op Plan:   Post-operative Plan: Extubation in OR  Informed Consent: I have reviewed the patients History and Physical, chart, labs and discussed the procedure including the risks, benefits and alternatives for the proposed anesthesia with the patient or authorized representative who has indicated his/her understanding and acceptance.   Dental advisory given  Plan Discussed with: CRNA  Anesthesia Plan Comments:         Anesthesia Quick Evaluation

## 2017-07-12 NOTE — H&P (Signed)
Justin LopesJonathan S Donovan is an 32 y.o. male.   Chief Complaint: Patient presents for hardware removal status post reconstruction of a small finger. HPI: Patient status post reconstruction of his finger.  He had a near amputation and reattachment.  He has retained hardware about the right small finger and desires to proceed with hardware removal under anesthesia today.  I discussed with him all issues as well as plans concerning his future.  He understands this and the findings.  Patient presents for evaluation and treatment of the of their upper extremity predicament. The patient denies neck, back, chest or  abdominal pain. The patient notes that they have no lower extremity problems. The patients primary complaint is noted. We are planning surgical care pathway for the upper extremity.  Past Medical History:  Diagnosis Date  . Anxiety   . Asthma    as a child  . Bipolar disorder (HCC)    "supposed to take Latuda, but it is too expensive"  . Closed head injury with concussion   . Complication of anesthesia    "I woke up from surgery in April (23, 2019) very mean"  . Depression   . History of kidney stones     passed x2 lithotripsy    Past Surgical History:  Procedure Laterality Date  . AMPUTATION Right 05/24/2017   Procedure: LITTLE FINGER REPAIR EXTENSOR AND FLEXOR TENDON, LATERAL LIGAMENT REPAIR, NERVE REPAIR, JOINT PINNING;  Surgeon: Dominica SeverinGramig, Sameeha Rockefeller, MD;  Location: [facility] OR;  Service: Orthopedics;  Laterality: Right;  . CHOLECYSTECTOMY    . CYSTOSCOPY W/ STONE MANIPULATION    . FOOT SURGERY Right    fracture  . LITHOTRIPSY      History reviewed. No pertinent family history. Social History:  reports that he has been smoking cigarettes.  He has a 21.00 pack-year smoking history. He has never used smokeless tobacco. He reports that he drinks about 3.6 oz of alcohol per week. He reports that he has current or past drug history. Drugs: Marijuana and Cocaine.  Allergies: No Known  Allergies  No medications prior to admission.    No results found for this or any previous visit (from the past 48 hour(s)). No results found.  Review of Systems  Respiratory: Negative.   Cardiovascular: Negative.   Gastrointestinal: Negative.   Genitourinary: Negative.     There were no vitals taken for this visit. Physical Exam deep hardware right small finger which is intact to vascular exam.  Reattachment has performed nicely in terms of viability to the tip of the finger The patient is alert and oriented in no acute distress. The patient complains of pain in the affected upper extremity.  The patient is noted to have a normal HEENT exam. Lung fields show equal chest expansion and no shortness of breath. Abdomen exam is nontender without distention. Lower extremity examination does not show any fracture dislocation or blood clot symptoms. Pelvis is stable and the neck and back are stable and nontender. Assessment/Plan we will plan for deep hardware removal We are planning surgery for your upper extremity. The risk and benefits of surgery to include risk of bleeding, infection, anesthesia,  damage to normal structures and failure of the surgery to accomplish its intended goals of relieving symptoms and restoring function have been discussed in detail. With this in mind we plan to proceed. I have specifically discussed with the patient the pre-and postoperative regime and the dos and don'ts and risk and benefits in great detail. Risk and benefits of surgery  also include risk of dystrophy(CRPS), chronic nerve pain, failure of the healing process to go onto completion and other inherent risks of surgery The relavent the pathophysiology of the disease/injury process, as well as the alternatives for treatment and postoperative course of action has been discussed in great detail with the patient who desires to proceed.  We will do everything in our power to help you (the patient) restore  function to the upper extremity. It is a pleasure to see this patient today.   Justin Cohn III, MD 07/12/2017, 10:01 AM

## 2017-07-12 NOTE — Anesthesia Postprocedure Evaluation (Signed)
Anesthesia Post Note  Patient: Justin LopesJonathan S Fifield  Procedure(s) Performed: Right small finger hardware removal, pin removal (Right Finger)     Anesthesia Post Evaluation  Last Vitals:  Vitals:   07/12/17 1442 07/12/17 2052  BP: 132/71 (!) 124/96  Pulse: 67 67  Resp: 18 14  Temp: 37 C (!) 36.3 C  SpO2: 100% 100%    Last Pain:  Vitals:   07/12/17 2052  TempSrc:   PainSc: 10-Worst pain ever                 [employer],Daelan Gatt

## 2017-07-12 NOTE — Discharge Instructions (Signed)
Keep your bandage clean and dry.  Dr. Amanda PeaGramig will call for your follow-up.  Use Norco as needed pain this is been phoned into your pharmacy.  Please live a healthy lifestyle.  We will remove your bandage in 10 days and begin gentle motion.  Your operation went beautifully

## 2017-07-13 ENCOUNTER — Encounter (HOSPITAL_COMMUNITY): Payer: Self-pay | Admitting: Orthopedic Surgery

## 2018-03-08 ENCOUNTER — Other Ambulatory Visit: Payer: Self-pay

## 2018-03-08 ENCOUNTER — Emergency Department: Payer: 59

## 2018-03-08 ENCOUNTER — Emergency Department
Admission: EM | Admit: 2018-03-08 | Discharge: 2018-03-08 | Disposition: A | Discharge status: Home / Self Care | Payer: 59 | Attending: Emergency Medicine | Admitting: Emergency Medicine

## 2018-03-08 DIAGNOSIS — J45909 Unspecified asthma, uncomplicated: Secondary | ICD-10-CM | POA: Insufficient documentation

## 2018-03-08 DIAGNOSIS — R079 Chest pain, unspecified: Secondary | ICD-10-CM | POA: Diagnosis not present

## 2018-03-08 DIAGNOSIS — Z563 Stressful work schedule: Secondary | ICD-10-CM | POA: Insufficient documentation

## 2018-03-08 DIAGNOSIS — Z566 Other physical and mental strain related to work: Secondary | ICD-10-CM

## 2018-03-08 DIAGNOSIS — F17210 Nicotine dependence, cigarettes, uncomplicated: Secondary | ICD-10-CM | POA: Insufficient documentation

## 2018-03-08 LAB — CBC
HEMATOCRIT: 48.3 % (ref 39.0–52.0)
Hemoglobin: 16.4 g/dL (ref 13.0–17.0)
MCH: 29.5 pg (ref 26.0–34.0)
MCHC: 34 g/dL (ref 30.0–36.0)
MCV: 86.9 fL (ref 80.0–100.0)
Platelets: 227 10*3/uL (ref 150–400)
RBC: 5.56 MIL/uL (ref 4.22–5.81)
RDW: 12.8 % (ref 11.5–15.5)
WBC: 7.1 10*3/uL (ref 4.0–10.5)
nRBC: 0 % (ref 0.0–0.2)

## 2018-03-08 LAB — BASIC METABOLIC PANEL
Anion gap: 8 (ref 5–15)
BUN: 11 mg/dL (ref 6–20)
CHLORIDE: 108 mmol/L (ref 98–111)
CO2: 22 mmol/L (ref 22–32)
CREATININE: 0.77 mg/dL (ref 0.61–1.24)
Calcium: 8.4 mg/dL — ABNORMAL LOW (ref 8.9–10.3)
GFR calc Af Amer: >60 mL/min (ref >60)
GLUCOSE: 98 mg/dL (ref 70–99)
Potassium: 3.7 mmol/L (ref 3.5–5.1)
Sodium: 138 mmol/L (ref 135–145)

## 2018-03-08 LAB — TROPONIN I: Troponin I: <0.03 ng/mL (ref <0.03)

## 2018-03-08 LAB — FIBRIN DERIVATIVES D-DIMER (ARMC ONLY): Fibrin derivatives D-dimer (ARMC): 206.46 ng/mL (FEU) (ref 0.00–499.00)

## 2018-03-08 MED ORDER — LORAZEPAM 1 MG PO TABS
1.0000 mg | ORAL_TABLET | Freq: Once | ORAL | Status: AC
  Administered 2018-03-08: 1 mg via ORAL
  Filled 2018-03-08: qty 1

## 2018-03-08 NOTE — ED Provider Notes (Signed)
[facility] Emergency Department Provider Note  ____________________________________________  Time seen: Approximately 3:15 PM  I have reviewed the triage vital signs and the nursing notes.   HISTORY  Chief Complaint Chest Pain    HPI [name] is a 33 y.o. male with a history of bipolar disorder and anxiety, not currently on medications, ongoing tobacco abuse, presenting with chest pain.  The patient reports that for the past week, he has had multiple daily episodes of a central and left-sided "sharp" chest pain "like someone is stabbing me."  This is associated with shortness of breath and last for several minutes and resolves on its own.  It is always in the setting of being at work and feeling stressed out.  He does not experience this with exertion, food, or positional changes.  He has not had any palpitations, lightheadedness or syncope, lower extremity swelling.  Today, the patient was donating plasma, which she has done intermittently for the last several years, when his blood pressure dropped and he developed chest pain, requiring him to stop his donation.  At this time, the patient is asymptomatic but states he had an episode of chest pain several minutes ago prior to my interview.  SH: Positive tobacco.  Denies cocaine.  FH: Grandparents with late onset CAD; no family history of early CAD.  Past Medical History:  Diagnosis Date  . Anxiety   . Asthma    as a child  . Bipolar disorder (HCC)    "supposed to take Latuda, but it is too expensive"  . Closed head injury with concussion   . Complication of anesthesia    "I woke up from surgery in April (23, 2019) very mean"  . Depression   . History of kidney stones     passed x2 lithotripsy    Patient Active Problem List   Diagnosis Date Noted  . Status post surgery 05/24/2017  . Laceration of finger, right 05/24/2017  . Opioid use disorder, severe, dependence (HCC) 03/26/2015  . Alcohol  use disorder, severe, dependence (HCC) 03/26/2015  . Tobacco use disorder 03/26/2015  . 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 AMPUTATION Right 05/24/2017   Procedure: LITTLE FINGER REPAIR EXTENSOR AND FLEXOR TENDON, LATERAL LIGAMENT REPAIR, NERVE REPAIR, JOINT PINNING;  Surgeon: Dominica SeverinGramig, William, MD;  Location: [facility] OR;  Service: Orthopedics;  Laterality: Right;  . CHOLECYSTECTOMY    . CYSTOSCOPY W/ STONE MANIPULATION    . FOOT SURGERY Right    fracture  . HARDWARE REMOVAL Right 07/12/2017   Procedure: Right small finger hardware removal, pin removal;  Surgeon: Dominica SeverinGramig, William, MD;  Location: [facility] OR;  Service: Orthopedics;  Laterality: Right;  45 mins  . LITHOTRIPSY      Current Outpatient Rx  . Order #: [id number] Class: Historical Med  . Order #: [id number] Class: Historical Med  . Order #: [id number] Class: Historical Med  . Order #: [id number] Class: Historical Med    Allergies Patient has no known allergies.  History reviewed. No pertinent family history.  Social History Social History   Tobacco Use  . Smoking status: Current Every Day Smoker    Packs/day: 1.50    Years: 14.00    Pack years: 21.00    Types: Cigarettes  . Smokeless tobacco: Never Used  Substance Use Topics  . Alcohol use: Yes    Alcohol/week: 6.0 standard drinks    Types:  6 Cans of beer per week  . Drug use: Yes    Types: Marijuana, Cocaine    Comment: Last time 6/72019    Review of Systems Constitutional: No fever/chills.  No lightheadedness or syncope. Eyes: No visual changes. ENT: No sore throat. No congestion or rhinorrhea. Cardiovascular: Positive chest pain. Denies palpitations.  Positive blood pressure while donating plasma. Respiratory: Positive shortness of breath.  No cough. Gastrointestinal: No abdominal pain.  No nausea, no vomiting.  No diarrhea.  No  constipation. Genitourinary: Negative for dysuria. Musculoskeletal: Negative for back pain.  No lower extremity swelling or calf pain. Skin: Negative for rash. Neurological: Negative for headaches. No focal numbness, tingling or weakness.  Psychiatric:Positive anxiety and situational stressor..  ____________________________________________   PHYSICAL EXAM:  VITAL SIGNS: ED Triage Vitals  Enc Vitals Group     BP 03/08/18 1341 (!) 120/91     Pulse Rate 03/08/18 1341 63     Resp 03/08/18 1341 20     Temp 03/08/18 1341 98.2 F (36.8 C)     Temp Source 03/08/18 1341 Oral     SpO2 03/08/18 1341 95 %     Weight 03/08/18 1339 194 lb (88 kg)     Height 03/08/18 1339 6\' 1"  (1.854 m)     Head Circumference --      Peak Flow --      Pain Score 03/08/18 1339 10     Pain Loc --      Pain Edu? --      Excl. in GC? --     Constitutional: Alert and oriented.  Answers questions appropriately.  Flat affect. Eyes: Conjunctivae are normal.  EOMI. No scleral icterus. Head: Atraumatic. Nose: No congestion/rhinnorhea. Mouth/Throat: Mucous membranes are moist.  Neck: No stridor.  Supple.  No JVD.  No meningismus. Cardiovascular: Normal rate, regular rhythm. No murmurs, rubs or gallops.  Respiratory: Normal respiratory effort.  No accessory muscle use or retractions. Lungs CTAB.  No wheezes, rales or ronchi. Gastrointestinal: Soft, nontender and nondistended.  No guarding or rebound.  No peritoneal signs. Musculoskeletal: No LE edema. No ttp in the calves or palpable cords.  Negative Homan's sign. Neurologic:  A&Ox3.  Speech is clear.  Face and smile are symmetric.  EOMI.  Moves all extremities well. Skin:  Skin is warm, dry and intact. No rash noted. Psychiatric: Mood is depressed and affect is flat.  ____________________________________________   LABS (all labs ordered are listed, but only abnormal results are displayed)  Labs Reviewed  BASIC METABOLIC PANEL - Abnormal; Notable for the  following components:      Result Value   Calcium 8.4 (*)    All other components within normal limits  CBC  TROPONIN I  TROPONIN I  FIBRIN DERIVATIVES D-DIMER ([facility] ONLY)   ____________________________________________  EKG  ED ECG REPORT I, Anne-Caroline Sharma Covert, the attending physician, personally viewed and interpreted this ECG.   Date: 03/08/2018  EKG Time: 1341  Rate: 58  Rhythm: sinus bradycardia  Axis: normal  Intervals:none  ST&T Change: No STEMI.  Nonspecific T wave inversion in V1.  No evidence of Brugada syndrome, hypertrophy, or prolonged QTC.  ____________________________________________  RADIOLOGY  Dg Chest 2 View  Result Date: 03/08/2018 CLINICAL DATA:  Shortness of breath and chest pain EXAM: CHEST - 2 VIEW COMPARISON:  November 28, 2005. FINDINGS: Lungs are clear. Heart size and pulmonary vascularity are normal. No adenopathy. No pneumothorax. No bone lesions. IMPRESSION: No edema or consolidation. Electronically Signed   By:  Bretta Bang III M.D.   On: 03/08/2018 14:34    ____________________________________________   PROCEDURES  Procedure(s) performed: None  Procedures  Critical Care performed: No ____________________________________________   INITIAL IMPRESSION / ASSESSMENT AND PLAN / ED COURSE  Pertinent labs & imaging results that were available during my care of the patient were reviewed by me and considered in my medical decision making (see chart for details).  33 y.o. male with a history of anxiety, bipolar, not on medications, presenting for 1 week of chest pain, and today low blood pressure while donating plasma.  Here, the patient's blood pressure is normal.  Will check orthostatics to see if he may be hypovolemic and needs intravenous fluids.  His work-up in the emergency department has been reassuring.  His EKG does not show any ischemia or arrhythmia.  His first troponin is negative and a second troponin is pending.  The patient's  chest x-ray does not show any acute cardiopulmonary process.  The patient symptoms do correlate to stressful situations, and this may be due to anxiety that is not treated at this time.  I will treat the patient with Ativan to see if this helps him feel better.  I have encouraged him to follow-up with his primary care physician to discuss his symptoms.  Plan reevaluation for final disposition.  ----------------------------------------- 4:49 PM on 03/08/2018 -----------------------------------------  Patient's work-up in the emergency department has been reassuring.  He has never been hypotensive here and has not have evidence of orthostasis.  His laboratory studies are reassuring.  He has 2- troponins and a negative d-dimer.  At this time, the patient is feeling better after Ativan.  I will have him follow-up with his primary care physician for long-term management of his anxiety and bipolar disorder, as well as for reevaluation of his chest pain.  At this time, the patient is safe for discharge.  Follow-up instructions as well as return precautions were discussed.  ____________________________________________  FINAL CLINICAL IMPRESSION(S) / ED DIAGNOSES  Final diagnoses:  Chest pain, unspecified type  Stress at work         NEW MEDICATIONS STARTED DURING THIS VISIT:  New Prescriptions   No medications on file      Rockne Menghini, MD 03/08/18 1650

## 2018-03-08 NOTE — Discharge Instructions (Addendum)
Please make an appointment with a primary care physician for reevaluation.  In addition, consider causes of your stress at work, and see if you are able to modify the factors that make you feel most stressed, which may prevent your symptoms.  Return to the emergency department if you develop severe pain, lightheadedness or fainting, palpitations, shortness of breath, fever, or any other symptoms concerning to you.

## 2018-03-08 NOTE — ED Triage Notes (Signed)
Pt c/o of L sided CP in rib cage. Pt states he donated plasma today, states his BP dropped. When pt left he started having CP. Also c/o L arm numbness. Talking in complete sentences. A&O, in wheelchair.

## 2018-03-15 ENCOUNTER — Other Ambulatory Visit: Payer: Self-pay

## 2018-03-15 ENCOUNTER — Emergency Department (HOSPITAL_COMMUNITY)
Admission: EM | Admit: 2018-03-15 | Discharge: 2018-03-15 | Disposition: A | Discharge status: Home / Self Care | Payer: 59 | Attending: Emergency Medicine | Admitting: Emergency Medicine

## 2018-03-15 ENCOUNTER — Encounter (HOSPITAL_COMMUNITY): Payer: Self-pay

## 2018-03-15 ENCOUNTER — Emergency Department (HOSPITAL_COMMUNITY): Payer: 59

## 2018-03-15 DIAGNOSIS — J45909 Unspecified asthma, uncomplicated: Secondary | ICD-10-CM | POA: Insufficient documentation

## 2018-03-15 DIAGNOSIS — Z79899 Other long term (current) drug therapy: Secondary | ICD-10-CM | POA: Insufficient documentation

## 2018-03-15 DIAGNOSIS — F319 Bipolar disorder, unspecified: Secondary | ICD-10-CM | POA: Insufficient documentation

## 2018-03-15 DIAGNOSIS — F17210 Nicotine dependence, cigarettes, uncomplicated: Secondary | ICD-10-CM | POA: Diagnosis not present

## 2018-03-15 DIAGNOSIS — F1120 Opioid dependence, uncomplicated: Secondary | ICD-10-CM | POA: Insufficient documentation

## 2018-03-15 DIAGNOSIS — F439 Reaction to severe stress, unspecified: Secondary | ICD-10-CM | POA: Diagnosis not present

## 2018-03-15 DIAGNOSIS — Z9049 Acquired absence of other specified parts of digestive tract: Secondary | ICD-10-CM | POA: Insufficient documentation

## 2018-03-15 DIAGNOSIS — F1020 Alcohol dependence, uncomplicated: Secondary | ICD-10-CM | POA: Diagnosis not present

## 2018-03-15 DIAGNOSIS — F419 Anxiety disorder, unspecified: Secondary | ICD-10-CM | POA: Diagnosis not present

## 2018-03-15 DIAGNOSIS — R079 Chest pain, unspecified: Secondary | ICD-10-CM | POA: Diagnosis not present

## 2018-03-15 LAB — I-STAT TROPONIN, ED: Troponin i, poc: 0.01 ng/mL (ref 0.00–0.08)

## 2018-03-15 LAB — BASIC METABOLIC PANEL
Anion gap: 6 (ref 5–15)
BUN: 15 mg/dL (ref 6–20)
CO2: 26 mmol/L (ref 22–32)
Calcium: 8.7 mg/dL — ABNORMAL LOW (ref 8.9–10.3)
Chloride: 105 mmol/L (ref 98–111)
Creatinine, Ser: 0.86 mg/dL (ref 0.61–1.24)
GFR calc Af Amer: >60 mL/min (ref >60)
GFR calc non Af Amer: >60 mL/min (ref >60)
GLUCOSE: 109 mg/dL — AB (ref 70–99)
Potassium: 3.6 mmol/L (ref 3.5–5.1)
Sodium: 137 mmol/L (ref 135–145)

## 2018-03-15 LAB — CBC
HCT: 47.1 % (ref 39.0–52.0)
Hemoglobin: 15.7 g/dL (ref 13.0–17.0)
MCH: 29.7 pg (ref 26.0–34.0)
MCHC: 33.3 g/dL (ref 30.0–36.0)
MCV: 89.2 fL (ref 80.0–100.0)
Platelets: 207 10*3/uL (ref 150–400)
RBC: 5.28 MIL/uL (ref 4.22–5.81)
RDW: 13.2 % (ref 11.5–15.5)
WBC: 9.1 10*3/uL (ref 4.0–10.5)
nRBC: 0 % (ref 0.0–0.2)

## 2018-03-15 MED ORDER — SODIUM CHLORIDE 0.9% FLUSH: 3.0000 mL | Freq: Once | INTRAVENOUS | Status: DC

## 2018-03-15 NOTE — ED Triage Notes (Signed)
Patient c/o mid chest pain that started last night. Patient states he took Aleve at 0400. Patient denies any SOB, nausea. Patient states he had a similar incident 3 weeks ago and the pain finally went away.

## 2018-03-15 NOTE — ED Notes (Signed)
ED Provider at bedside. 

## 2018-03-15 NOTE — ED Notes (Signed)
Patient transported to X-ray 

## 2018-03-15 NOTE — ED Provider Notes (Signed)
[facility]-EMERGENCY DEPT Provider Note   CSN: [id number] Arrival date & time: 03/15/18  0854     History   Chief Complaint Chief Complaint  Patient presents with  . Chest Pain    HPI [name] is a 33 y.o. male.  HPI   He presents for evaluation of chest discomfort which "feels like it is caving in."  He noticed it this morning so decided come here for evaluation.  He had a similar episode, and was recently evaluated in the ED for the same process.  Evaluation there was negative and he was instructed to follow-up with the PCP.  Patient relates stress, related to separation from his wife and children.  He feels like he cannot see his children, because of other issues in his life including that he is seeing a mother woman, living with her and her children.  He does not have enough money to engage a lawyer about getting a divorce.  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  He has not considered counseling yet for his discomfort.  He denies cough, shortness of breath, weakness or dizziness.  There are no other known modifying factors.  Past Medical History:  Diagnosis Date  . Anxiety   . Asthma    as a child  . Bipolar disorder (HCC)    "supposed to take Latuda, but it is too expensive"  . Closed head injury with concussion   . Complication of anesthesia    "I woke up from surgery in April (23, 2019) very mean"  . Depression   . History of kidney stones     passed x2 lithotripsy    Patient Active Problem List   Diagnosis Date Noted  . Status post surgery 05/24/2017  . Laceration of finger, right 05/24/2017  . Opioid use disorder, severe, dependence (HCC) 03/26/2015  . Alcohol use disorder, severe, dependence (HCC) 03/26/2015  . Tobacco use disorder 03/26/2015  . 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 AMPUTATION Right 05/24/2017   Procedure: LITTLE FINGER REPAIR EXTENSOR AND FLEXOR TENDON, LATERAL LIGAMENT REPAIR, NERVE REPAIR, JOINT PINNING;  Surgeon: Dominica Severin, MD;  Location: [facility] OR;  Service: Orthopedics;  Laterality: Right;  . CHOLECYSTECTOMY    . CYSTOSCOPY W/ STONE MANIPULATION    . FOOT SURGERY Right    fracture  . HARDWARE REMOVAL Right 07/12/2017   Procedure: Right small finger hardware removal, pin removal;  Surgeon: Dominica Severin, MD;  Location: [facility] OR;  Service: Orthopedics;  Laterality: Right;  45 mins  . LITHOTRIPSY          Home Medications    Prior to Admission medications   Medication Sig Start Date End Date Taking? Authorizing Provider  acetaminophen (TYLENOL) 325 MG tablet Take 650 mg by mouth every 6 (six) hours as needed for mild pain.    [provider]  ibuprofen (ADVIL,MOTRIN) 200 MG tablet Take 200 mg by mouth every 6 (six) hours as needed for moderate pain.    [provider]  Multiple Vitamin (MULTIVITAMIN) tablet Take 1 tablet by mouth daily.    [provider]  oxyCODONE (OXY IR/ROXICODONE) 5 MG immediate release tablet Take 5 mg by mouth every 6 (six) hours as needed for moderate pain.  06/13/17   [provider]    Family History Family History  Problem Relation Age  of Onset  . Depression Mother     Social History Social History   Tobacco Use  . Smoking status: Current Every Day Smoker    Packs/day: 1.50    Years: 14.00    Pack years: 21.00    Types: Cigarettes  . Smokeless tobacco: Never Used  Substance Use Topics  . Alcohol use: Yes    Alcohol/week: 6.0 standard drinks    Types: 6 Cans of beer per week  . Drug use: Yes    Types: Marijuana     Allergies   Patient has no known allergies.   Review of Systems Review of Systems  All other systems reviewed and are negative.    Physical Exam Updated Vital Signs BP (!) 141/75   Pulse 72   Temp (!) 97.5 F (36.4 C)  (Oral)   Resp 13   Ht 6\' 1"  (1.854 m)   Wt 88.5 kg   SpO2 96%   BMI 25.73 kg/m   Physical Exam Vitals signs and nursing note reviewed.  Constitutional:      General: He is in acute distress (Crying).     Appearance: He is well-developed. He is not ill-appearing, toxic-appearing or diaphoretic.  HENT:     Head: Normocephalic and atraumatic.     Right Ear: External ear normal.     Left Ear: External ear normal.  Eyes:     Conjunctiva/sclera: Conjunctivae normal.     Pupils: Pupils are equal, round, and reactive to light.  Neck:     Musculoskeletal: Normal range of motion and neck supple.     Trachea: Phonation normal.  Cardiovascular:     Rate and Rhythm: Normal rate.     Heart sounds: Normal heart sounds.  Pulmonary:     Effort: Pulmonary effort is normal.  Musculoskeletal: Normal range of motion.  Skin:    General: Skin is warm and dry.  Neurological:     Mental Status: He is alert and oriented to person, place, and time.     Cranial Nerves: No cranial nerve deficit.     Sensory: No sensory deficit.     Motor: No abnormal muscle tone.     Coordination: Coordination normal.  Psychiatric:        Behavior: Behavior normal.        Thought Content: Thought content normal.        Judgment: Judgment normal.     Comments: Sad appearance      ED Treatments / Results  Labs (all labs ordered are listed, but only abnormal results are displayed) Labs Reviewed  BASIC METABOLIC PANEL  CBC  I-STAT TROPONIN, ED    EKG EKG Interpretation  Date/Time:  Wednesday March 15 2018 09:01:22 EST Ventricular Rate:  82 PR Interval:    QRS Duration: 96 QT Interval:  370 QTC Calculation: 433 R Axis:   78 Text Interpretation:  Sinus rhythm ST elev, probable normal early repol pattern since last tracing no significant change Confirmed by Mancel BaleWentz, Hargun Spurling [phone]([id number]) on 03/15/2018 10:16:07 AM   Radiology Dg Chest 2 View  Result Date: 03/15/2018 CLINICAL DATA:  Chest pain EXAM: CHEST  - 2 VIEW COMPARISON:  03/08/2018 FINDINGS: Normal heart size and mediastinal contours. No acute infiltrate or edema. No effusion or pneumothorax. Mild scoliosis. No acute osseous findings. IMPRESSION: Negative chest. Electronically Signed   By: Marnee SpringJonathon  Watts M.D.   On: 03/15/2018 09:48    Procedures Procedures (including critical care time)  Medications Ordered in ED Medications  sodium chloride  flush (NS) 0.9 % injection 3 mL (0 mLs Intravenous Hold 03/15/18 0916)     Initial Impression / Assessment and Plan / ED Course  I have reviewed the triage vital signs and the nursing notes.  Pertinent labs & imaging results that were available during my care of the patient were reviewed by me and considered in my medical decision making (see chart for details).      Patient Vitals for the past 24 hrs:  BP Temp Temp src Pulse Resp SpO2 Height Weight  03/15/18 0946 (!) 141/75 - - 72 13 96 % - -  03/15/18 0903 (!) 153/94 (!) 97.5 F (36.4 C) Oral 89 (!) 24 100 % - -  03/15/18 0901 - - - - - - 6\' 1"  (1.854 m) 88.5 kg  03/15/18 0900 - - - - - 100 % - -    10:16 AM Reevaluation with update and discussion. After initial assessment and treatment, an updated evaluation reveals no change in clinical status, findings discussed with the patient and all questions were answered. Mancel Bale   Medical Decision Making: Nonspecific chest pain recent comprehensive cardiac evaluation the emergency department.  No indication for ongoing ED evaluation or hospitalization, at this time.  Patient clearly stressed and will need counseling and best management, to improve his symptoms.  CRITICAL CARE-no Performed by: Mancel Bale  Nursing Notes Reviewed/ Care Coordinated Applicable Imaging Reviewed Interpretation of Laboratory Data incorporated into ED treatment  The patient appears reasonably screened and/or stabilized for discharge and I doubt any other medical condition or other [facility] requiring further  screening, evaluation, or treatment in the ED at this time prior to discharge.  Plan: Home Medications-OTC, analgesia as needed; Home Treatments-rest, stress management; return here if the recommended treatment, does not improve the symptoms; Recommended follow up-counselor of choice ASAP   Final Clinical Impressions(s) / ED Diagnoses   Final diagnoses:  Nonspecific chest pain  Stress    ED Discharge Orders    None       Mancel Bale, MD 03/15/18 1017

## 2018-03-15 NOTE — Discharge Instructions (Addendum)
Your heart and lungs are normal today.  The stress you are experiencing is causing her symptoms.  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  We are supplying information about where you can go to get some help with counseling, and stress management.  This will help you work through the process.

## 2018-03-16 ENCOUNTER — Ambulatory Visit: Payer: 59 | Admitting: Family Medicine

## 2018-03-27 ENCOUNTER — Emergency Department
Admission: EM | Admit: 2018-03-27 | Discharge: 2018-03-27 | Disposition: A | Discharge status: Home / Self Care | Payer: 59 | Attending: Student in an Organized Health Care Education/Training Program | Admitting: Student in an Organized Health Care Education/Training Program

## 2018-03-27 ENCOUNTER — Emergency Department: Payer: 59

## 2018-03-27 ENCOUNTER — Encounter: Payer: Self-pay | Admitting: Emergency Medicine

## 2018-03-27 DIAGNOSIS — F17210 Nicotine dependence, cigarettes, uncomplicated: Secondary | ICD-10-CM | POA: Insufficient documentation

## 2018-03-27 DIAGNOSIS — J45909 Unspecified asthma, uncomplicated: Secondary | ICD-10-CM | POA: Diagnosis not present

## 2018-03-27 DIAGNOSIS — J40 Bronchitis, not specified as acute or chronic: Secondary | ICD-10-CM | POA: Insufficient documentation

## 2018-03-27 DIAGNOSIS — R079 Chest pain, unspecified: Secondary | ICD-10-CM | POA: Diagnosis present

## 2018-03-27 DIAGNOSIS — R0789 Other chest pain: Secondary | ICD-10-CM

## 2018-03-27 LAB — CBC
HCT: 45.2 % (ref 39.0–52.0)
HEMOGLOBIN: 15.5 g/dL (ref 13.0–17.0)
MCH: 29.4 pg (ref 26.0–34.0)
MCHC: 34.3 g/dL (ref 30.0–36.0)
MCV: 85.8 fL (ref 80.0–100.0)
Platelets: 217 10*3/uL (ref 150–400)
RBC: 5.27 MIL/uL (ref 4.22–5.81)
RDW: 13 % (ref 11.5–15.5)
WBC: 10.1 10*3/uL (ref 4.0–10.5)
nRBC: 0 % (ref 0.0–0.2)

## 2018-03-27 LAB — BASIC METABOLIC PANEL
ANION GAP: 6 (ref 5–15)
BUN: 13 mg/dL (ref 6–20)
CO2: 24 mmol/L (ref 22–32)
Calcium: 8.3 mg/dL — ABNORMAL LOW (ref 8.9–10.3)
Chloride: 107 mmol/L (ref 98–111)
Creatinine, Ser: 0.89 mg/dL (ref 0.61–1.24)
GFR calc Af Amer: >60 mL/min (ref >60)
GFR calc non Af Amer: >60 mL/min (ref >60)
GLUCOSE: 99 mg/dL (ref 70–99)
Potassium: 3.8 mmol/L (ref 3.5–5.1)
Sodium: 137 mmol/L (ref 135–145)

## 2018-03-27 LAB — TROPONIN I: Troponin I: <0.03 ng/mL (ref <0.03)

## 2018-03-27 MED ORDER — PREDNISONE 20 MG PO TABS: 40.0000 mg | ORAL_TABLET | Freq: Every day | ORAL | 0 refills | Status: AC

## 2018-03-27 MED ORDER — KETOROLAC TROMETHAMINE 30 MG/ML IJ SOLN
30.0000 mg | Freq: Once | INTRAMUSCULAR | Status: AC
  Administered 2018-03-27: 30 mg via INTRAMUSCULAR
  Filled 2018-03-27: qty 1

## 2018-03-27 MED ORDER — LIDOCAINE 5 % EX PTCH
1.0000 | MEDICATED_PATCH | CUTANEOUS | Status: DC
  Administered 2018-03-27: 1 via TRANSDERMAL
  Filled 2018-03-27 (×2): qty 1

## 2018-03-27 MED ORDER — IPRATROPIUM-ALBUTEROL 0.5-2.5 (3) MG/3ML IN SOLN
3.0000 mL | Freq: Once | RESPIRATORY_TRACT | Status: AC
  Administered 2018-03-27: 3 mL via RESPIRATORY_TRACT
  Filled 2018-03-27: qty 3

## 2018-03-27 MED ORDER — ALBUTEROL SULFATE HFA 108 (90 BASE) MCG/ACT IN AERS: 2.0000 | INHALATION_SPRAY | Freq: Four times a day (QID) | RESPIRATORY_TRACT | 2 refills | Status: DC | PRN

## 2018-03-27 MED ORDER — NAPROXEN 500 MG PO TABS: 500.0000 mg | ORAL_TABLET | Freq: Two times a day (BID) | ORAL | 0 refills | Status: DC

## 2018-03-27 MED ORDER — CYCLOBENZAPRINE HCL 10 MG PO TABS: 10.0000 mg | ORAL_TABLET | Freq: Three times a day (TID) | ORAL | 0 refills | Status: DC | PRN

## 2018-03-27 NOTE — ED Triage Notes (Signed)
Pt reports pain to right side chest under breast, states that he feels like someone is squeezing his ribs. Pt also reports some SOB.

## 2018-03-27 NOTE — ED Provider Notes (Signed)
[facility] Emergency Department Provider Note    First MD Initiated Contact with Patient 03/27/18 1214     (approximate)  I have reviewed the triage vital signs and the nursing notes.   HISTORY  Chief Complaint Chest Pain; Rib Injury; and Shortness of Breath    HPI [name] is a 33 y.o. male below listed past medical history presents the ER for several weeks of persistent bilateral rib pain and chest discomfort associated with some shortness of breath.  Says he was moving earlier today and made a twisting motion which made worsening of his symptoms.  Denies any fevers but does have nonproductive nonbloody cough.  Does smoke.  Several family members recently diagnosed with bronchitis.  Denies any abdominal pain.  No nausea or vomiting.  States the pain is mild to severe.  While in the room person accompanying patient is mouthing "do not give any narcotics.  ".  Does appear to have a history of opiate dependence.  Does appear uncomfortable.     Past Medical History:  Diagnosis Date  . Anxiety   . Asthma    as a child  . Bipolar disorder (HCC)    "supposed to take Latuda, but it is too expensive"  . Closed head injury with concussion   . Complication of anesthesia    "I woke up from surgery in April (23, 2019) very mean"  . Depression   . History of kidney stones     passed x2 lithotripsy   Family History  Problem Relation Age of Onset  . Depression Mother    Past Surgical History:  Procedure Laterality Date  . AMPUTATION Right 05/24/2017   Procedure: LITTLE FINGER REPAIR EXTENSOR AND FLEXOR TENDON, LATERAL LIGAMENT REPAIR, NERVE REPAIR, JOINT PINNING;  Surgeon: Dominica Severin, MD;  Location: [facility] OR;  Service: Orthopedics;  Laterality: Right;  . CHOLECYSTECTOMY    . CYSTOSCOPY W/ STONE MANIPULATION    . FOOT SURGERY Right    fracture  . HARDWARE REMOVAL Right 07/12/2017   Procedure: Right small finger hardware removal, pin removal;   Surgeon: Dominica Severin, MD;  Location: [facility] OR;  Service: Orthopedics;  Laterality: Right;  45 mins  . LITHOTRIPSY     Patient Active Problem List   Diagnosis Date Noted  . Status post surgery 05/24/2017  . Laceration of finger, right 05/24/2017  . Opioid use disorder, severe, dependence (HCC) 03/26/2015  . Alcohol use disorder, severe, dependence (HCC) 03/26/2015  . Tobacco use disorder 03/26/2015  . 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 Authorizing Provider  acetaminophen (TYLENOL) 325 MG tablet Take 650 mg by mouth every 6 (six) hours as needed for mild pain.    [provider]  albuterol (PROVENTIL HFA;VENTOLIN HFA) 108 (90 Base) MCG/ACT inhaler Inhale 2 puffs into the lungs every 6 (six) hours as needed for wheezing or shortness of breath. 03/27/18   Willy Eddy, MD  cyclobenzaprine (FLEXERIL) 10 MG tablet Take 1 tablet (10 mg total) by mouth 3 (three) times daily as needed for muscle spasms. 03/27/18   Willy Eddy, MD  ibuprofen (ADVIL,MOTRIN) 200 MG tablet Take 200 mg by mouth every 6 (six) hours as needed for moderate pain.    [provider]  Multiple Vitamin (MULTIVITAMIN) tablet Take 1 tablet by mouth daily.    [provider]  naproxen (NAPROSYN) 500 MG tablet Take 1 tablet (500 mg total) by mouth 2 (two) times daily with a meal. 03/27/18 03/27/19  Willy Eddy, MD  oxyCODONE (OXY IR/ROXICODONE) 5 MG immediate release tablet Take 5 mg by mouth every 6 (six) hours as needed for moderate pain.  06/13/17   [provider]  predniSONE (DELTASONE) 20 MG tablet Take 2 tablets (40 mg total) by mouth daily for 5 days. 03/27/18 04/01/18  Willy Eddy, MD    Allergies Patient has no known allergies.    Social History Social History   Tobacco Use  . Smoking status:  Current Every Day Smoker    Packs/day: 1.50    Years: 14.00    Pack years: 21.00    Types: Cigarettes  . Smokeless tobacco: Never Used  Substance Use Topics  . Alcohol use: Yes    Alcohol/week: 6.0 standard drinks    Types: 6 Cans of beer per week  . Drug use: Yes    Types: Marijuana    Review of Systems Patient denies headaches, rhinorrhea, blurry vision, numbness, shortness of breath, chest pain, edema, cough, abdominal pain, nausea, vomiting, diarrhea, dysuria, fevers, rashes or hallucinations unless otherwise stated above in HPI. ____________________________________________   PHYSICAL EXAM:  VITAL SIGNS: Vitals:   03/27/18 1301  BP: 123/70  Pulse: 60  Resp: 18  SpO2: 97%    Constitutional: Alert and oriented.  Eyes: Conjunctivae are normal.  Head: Atraumatic. Nose: No congestion/rhinnorhea. Mouth/Throat: Mucous membranes are moist.   Neck: No stridor. Painless ROM.  Cardiovascular: Normal rate, regular rhythm. Grossly normal heart sounds.  Good peripheral circulation. Respiratory: Normal respiratory effort.  No retractions. Lungs with coarse bibasilar breathsounds Gastrointestinal: Soft and nontender. No distention. No abdominal bruits. No CVA tenderness. Genitourinary:  Musculoskeletal: Pain reproduced with palpation of the right inferior rib cage.  No crepitus or overlying ecchymosis or erythema.  No lower extremity tenderness nor edema.  No joint effusions. Neurologic:  Normal speech and language. No gross focal neurologic deficits are appreciated. No facial droop Skin:  Skin is warm, dry and intact. No rash noted. Psychiatric: Mood and affect are normal. Speech and behavior are normal.  ____________________________________________   LABS (all labs ordered are listed, but only abnormal results are displayed)  Results for orders placed or performed during the hospital encounter of 03/27/18 (from the past 24 hour(s))  Basic metabolic panel     Status: Abnormal     Collection Time: 03/27/18 10:13 AM  Result Value Ref Range   Sodium 137 135 - 145 mmol/L   Potassium 3.8 3.5 - 5.1 mmol/L   Chloride 107 98 - 111 mmol/L   CO2 24 22 - 32 mmol/L   Glucose, Bld 99 70 - 99 mg/dL   BUN 13 6 - 20 mg/dL   Creatinine, Ser 1.61 0.61 - 1.24 mg/dL   Calcium 8.3 (L) 8.9 - 10.3 mg/dL   GFR calc non Af Amer >60 >60 mL/min   GFR calc Af Amer >60 >60 mL/min   Anion gap 6 5 - 15  CBC     Status: None   Collection Time: 03/27/18 10:13 AM  Result Value Ref Range   WBC 10.1 4.0 - 10.5 K/uL   RBC 5.27 4.22 - 5.81 MIL/uL   Hemoglobin 15.5 13.0 - 17.0 g/dL   HCT 09.6 04.5 - 40.9 %   MCV 85.8 80.0 - 100.0 fL   MCH 29.4 26.0 - 34.0 pg   MCHC 34.3 30.0 - 36.0 g/dL  RDW 13.0 11.5 - 15.5 %   Platelets 217 150 - 400 K/uL   nRBC 0.0 0.0 - 0.2 %  Troponin I - ONCE - STAT     Status: None   Collection Time: 03/27/18 10:13 AM  Result Value Ref Range   Troponin I <0.03 <0.03 ng/mL   ____________________________________________  EKG My review and personal interpretation at Time: 10:11   Indication: chest pain  Rate: 60  Rhythm: sinus Axis: normal Other: normal intervals, no stemi ____________________________________________  RADIOLOGY  I personally reviewed all radiographic images ordered to evaluate for the above acute complaints and reviewed radiology reports and findings.  These findings were personally discussed with the patient.  Please see medical record for radiology report.  ____________________________________________   PROCEDURES  Procedure(s) performed:  Procedures    Critical Care performed: no ____________________________________________   INITIAL IMPRESSION / ASSESSMENT AND PLAN / ED COURSE  Pertinent labs & imaging results that were available during my care of the patient were reviewed by me and considered in my medical decision making (see chart for details).   DDX: Bronchitis, costochondritis, pleurisy, pneumothorax, ACS,  PE  ZETHAN KEAYS is a 33 y.o. who presents to the ED with symptoms as described above.  Patient nontoxic-appearing afebrile and hemodynamically stable.  He is low risk by Wells criteria and is PERC negative.  EKG shows no evidence of acute ischemia.  Chest x-ray with no evidence of pneumothorax.  Does have some coarse breathing given his history of smoking have a high suspicion for bronchitis and probable costochondritis.  Will give Toradol as well as nebulizer and reassess.  Clinical Course as of Mar 27 1333  Mon Mar 27, 2018  1323 Patient with significant improvement in symptoms after nebulizer as well as anti-inflammatory medication.  Will discharge with treatment for bronchitis.  Discussed follow-up and signs and symptoms for which patient should return to the ER.   [PR]    Clinical Course User Index [PR] Willy Eddy, MD     As part of my medical decision making, I reviewed the following data within the electronic MEDICAL RECORD NUMBER Nursing notes reviewed and incorporated, Labs reviewed, notes from prior ED visits and [place] Controlled Substance Database   ____________________________________________   FINAL CLINICAL IMPRESSION(S) / ED DIAGNOSES  Final diagnoses:  Chest wall pain  Bronchitis      NEW MEDICATIONS STARTED DURING THIS VISIT:  New Prescriptions   ALBUTEROL (PROVENTIL HFA;VENTOLIN HFA) 108 (90 BASE) MCG/ACT INHALER    Inhale 2 puffs into the lungs every 6 (six) hours as needed for wheezing or shortness of breath.   CYCLOBENZAPRINE (FLEXERIL) 10 MG TABLET    Take 1 tablet (10 mg total) by mouth 3 (three) times daily as needed for muscle spasms.   NAPROXEN (NAPROSYN) 500 MG TABLET    Take 1 tablet (500 mg total) by mouth 2 (two) times daily with a meal.   PREDNISONE (DELTASONE) 20 MG TABLET    Take 2 tablets (40 mg total) by mouth daily for 5 days.     Note:  This document was prepared using Dragon voice recognition software and may include unintentional  dictation errors.    Willy Eddy, MD 03/27/18 1335

## 2018-04-03 ENCOUNTER — Emergency Department (HOSPITAL_COMMUNITY): Payer: 59

## 2018-04-03 ENCOUNTER — Encounter (HOSPITAL_COMMUNITY): Payer: Self-pay | Admitting: Emergency Medicine

## 2018-04-03 ENCOUNTER — Emergency Department (HOSPITAL_COMMUNITY)
Admission: EM | Admit: 2018-04-03 | Discharge: 2018-04-03 | Disposition: A | Discharge status: Home / Self Care | Payer: 59 | Attending: Emergency Medicine | Admitting: Emergency Medicine

## 2018-04-03 ENCOUNTER — Other Ambulatory Visit: Payer: Self-pay

## 2018-04-03 DIAGNOSIS — Y929 Unspecified place or not applicable: Secondary | ICD-10-CM | POA: Insufficient documentation

## 2018-04-03 DIAGNOSIS — Y939 Activity, unspecified: Secondary | ICD-10-CM | POA: Diagnosis not present

## 2018-04-03 DIAGNOSIS — S99921A Unspecified injury of right foot, initial encounter: Secondary | ICD-10-CM | POA: Diagnosis present

## 2018-04-03 DIAGNOSIS — J45909 Unspecified asthma, uncomplicated: Secondary | ICD-10-CM | POA: Insufficient documentation

## 2018-04-03 DIAGNOSIS — F17210 Nicotine dependence, cigarettes, uncomplicated: Secondary | ICD-10-CM | POA: Diagnosis not present

## 2018-04-03 DIAGNOSIS — S92301A Fracture of unspecified metatarsal bone(s), right foot, initial encounter for closed fracture: Secondary | ICD-10-CM | POA: Insufficient documentation

## 2018-04-03 DIAGNOSIS — Z89021 Acquired absence of right finger(s): Secondary | ICD-10-CM | POA: Insufficient documentation

## 2018-04-03 DIAGNOSIS — Z79899 Other long term (current) drug therapy: Secondary | ICD-10-CM | POA: Insufficient documentation

## 2018-04-03 DIAGNOSIS — Y990 Civilian activity done for income or pay: Secondary | ICD-10-CM | POA: Diagnosis not present

## 2018-04-03 MED ORDER — HYDROMORPHONE HCL 1 MG/ML IJ SOLN
0.5000 mg | Freq: Once | INTRAMUSCULAR | Status: AC
  Administered 2018-04-03: 0.5 mg via INTRAVENOUS
  Filled 2018-04-03: qty 1

## 2018-04-03 MED ORDER — OXYCODONE-ACETAMINOPHEN 5-325 MG PO TABS: 1.0000 | ORAL_TABLET | Freq: Four times a day (QID) | ORAL | 0 refills | Status: DC | PRN

## 2018-04-03 NOTE — ED Triage Notes (Signed)
Patient arrived via POV after foot being run over with fork lift. Complaints of pain. Redness and swelling present in right foot. Sensation present. Unable to move toes. Deformity present. PA present at bedside. Pt in obvious distress.

## 2018-04-03 NOTE — Consult Note (Signed)
Reason for Consult:Right foot fxs Referring Physician: R Hong [name] is an 33 y.o. male.  HPI: [name] had his right foot run over today by a forklift. He came to the ED for evaluation with severe pain. X-rays showed 2-4 MT fxs and orthopedic surgery was consulted. He denies sensory change in the foot.  Past Medical History:  Diagnosis Date  . Anxiety   . Asthma    as a child  . Bipolar disorder (HCC)    "supposed to take Latuda, but it is too expensive"  . Closed head injury with concussion   . Complication of anesthesia    "I woke up from surgery in April (23, 2019) very mean"  . Depression   . History of kidney stones     passed x2 lithotripsy    Past Surgical History:  Procedure Laterality Date  . AMPUTATION Right 05/24/2017   Procedure: LITTLE FINGER REPAIR EXTENSOR AND FLEXOR TENDON, LATERAL LIGAMENT REPAIR, NERVE REPAIR, JOINT PINNING;  Surgeon: Dominica Severin, MD;  Location: [facility] OR;  Service: Orthopedics;  Laterality: Right;  . CHOLECYSTECTOMY    . CYSTOSCOPY W/ STONE MANIPULATION    . FOOT SURGERY Right    fracture  . HARDWARE REMOVAL Right 07/12/2017   Procedure: Right small finger hardware removal, pin removal;  Surgeon: Dominica Severin, MD;  Location: [facility] OR;  Service: Orthopedics;  Laterality: Right;  45 mins  . LITHOTRIPSY      Family History  Problem Relation Age of Onset  . Depression Mother     Social History:  reports that he has been smoking cigarettes. He has a 21.00 pack-year smoking history. He has never used smokeless tobacco. He reports current alcohol use of about 6.0 standard drinks of alcohol per week. He reports current drug use. Drug: Marijuana.  Allergies: No Known Allergies  Medications: I have reviewed the patient's current medications.  No results found for this or any previous visit (from the past 48 hour(s)).  Dg Foot Complete Right  Result Date: 04/03/2018 CLINICAL DATA:  Forklift ran over foot with pain, initial  encounter EXAM: RIGHT FOOT COMPLETE - 3+ VIEW COMPARISON:  None. FINDINGS: Oblique fractures of the distal aspect of the second through fourth metatarsals are seen with mild displacement laterally. No other fracture is seen. Tarsal degenerative changes are noted. IMPRESSION: Fractures of the second through fourth metatarsals. Electronically Signed   By: Alcide Clever M.D.   On: 04/03/2018 14:18    Review of Systems  Constitutional: Negative for weight loss.  HENT: Negative for ear discharge, ear pain, hearing loss and tinnitus.   Eyes: Negative for blurred vision, double vision, photophobia and pain.  Respiratory: Negative for cough, sputum production and shortness of breath.   Cardiovascular: Negative for chest pain.  Gastrointestinal: Negative for abdominal pain, nausea and vomiting.  Genitourinary: Negative for dysuria, flank pain, frequency and urgency.  Musculoskeletal: Positive for joint pain (Right foot). Negative for back pain, falls, myalgias and neck pain.  Neurological: Negative for dizziness, tingling, sensory change, focal weakness, loss of consciousness and headaches.  Endo/Heme/Allergies: Does not bruise/bleed easily.  Psychiatric/Behavioral: Negative for depression, memory loss and substance abuse. The patient is not nervous/anxious.    Blood pressure (!) 138/125, pulse 96, temperature 98.5 F (36.9 C), resp. rate (!) 26, height 6\' 1"  (1.854 m), weight 90.7 kg, SpO2 100 %. Physical Exam  Constitutional: He appears well-developed and well-nourished. No distress.  HENT:  Head: Normocephalic and atraumatic.  Eyes: Conjunctivae are normal. Right  eye exhibits no discharge. Left eye exhibits no discharge. No scleral icterus.  Neck: Normal range of motion.  Cardiovascular: Normal rate and regular rhythm.  Respiratory: Effort normal. No respiratory distress.  Musculoskeletal:     Comments: RLE Mild abrasions webspaces, ecchymosis over forefoot, no rash  Severe TTP  No knee or  ankle effusion  Knee stable to varus/ valgus and anterior/posterior stress  Sens DPN, SPN, TN intact  Motor EHL, ext, flex, evers grossly intact, limited by pain  DP 2+, PT 1+, 2+ NP edema  Neurological: He is alert.  Skin: Skin is warm and dry. He is not diaphoretic.  Psychiatric: He has a normal mood and affect. His behavior is normal.    Assessment/Plan: Right 2-4 MT fxs -- Post-op shoe, WBAT through heel. F/u with Dr. Jena Gauss in 1-2 weeks. Return for s/sx of compartment syndrome.    Freeman Caldron, PA-C Orthopedic Surgery [phone] 04/03/2018, 2:42 PM

## 2018-04-03 NOTE — ED Provider Notes (Signed)
[facility] EMERGENCY DEPARTMENT Provider Note   CSN: [id number] Arrival date & time: 04/03/18  1339    History   Chief Complaint Chief Complaint  Patient presents with  . Foot Pain    HPI [name] is a 33 y.o. male.     HPI   33 year old male presents today with complaints of right foot injury.  He notes he was at work when a forklift ran over his right foot.  He notes immediate pain and inability to ambulate.  No medications prior to arrival.  He denies any pain to his ankle or remainder of the lower extremity.  Patient does note a history of foot fracture previous with surgical repair performed by Dr. Magnus Ivan.  Patient notes a history of opioid use, reports that he has not been using recently.  Patient notes he has been smoking marijuana.  Last oral intake was approximately 1230 noting he ate pretzels and Sprite  Past Medical History:  Diagnosis Date  . Anxiety   . Asthma    as a child  . Bipolar disorder (HCC)    "supposed to take Latuda, but it is too expensive"  . Closed head injury with concussion   . Complication of anesthesia    "I woke up from surgery in April (23, 2019) very mean"  . Depression   . History of kidney stones     passed x2 lithotripsy    Patient Active Problem List   Diagnosis Date Noted  . Status post surgery 05/24/2017  . Laceration of finger, right 05/24/2017  . Opioid use disorder, severe, dependence (HCC) 03/26/2015  . Alcohol use disorder, severe, dependence (HCC) 03/26/2015  . Tobacco use disorder 03/26/2015  . 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 AMPUTATION Right 05/24/2017   Procedure: LITTLE FINGER REPAIR EXTENSOR AND FLEXOR TENDON, LATERAL LIGAMENT REPAIR, NERVE REPAIR, JOINT PINNING;  Surgeon: Dominica Severin, MD;  Location: [facility] OR;  Service: Orthopedics;   Laterality: Right;  . CHOLECYSTECTOMY    . CYSTOSCOPY W/ STONE MANIPULATION    . FOOT SURGERY Right    fracture  . HARDWARE REMOVAL Right 07/12/2017   Procedure: Right small finger hardware removal, pin removal;  Surgeon: Dominica Severin, MD;  Location: [facility] OR;  Service: Orthopedics;  Laterality: Right;  45 mins  . LITHOTRIPSY          Home Medications    Prior to Admission medications   Medication Sig Start Date End Date Taking? Authorizing Provider  acetaminophen (TYLENOL) 325 MG tablet Take 650 mg by mouth every 6 (six) hours as needed for mild pain.    [provider]  albuterol (PROVENTIL HFA;VENTOLIN HFA) 108 (90 Base) MCG/ACT inhaler Inhale 2 puffs into the lungs every 6 (six) hours as needed for wheezing or shortness of breath. 03/27/18   Willy Eddy, MD  cyclobenzaprine (FLEXERIL) 10 MG tablet Take 1 tablet (10 mg total) by mouth 3 (three) times daily as needed for muscle spasms. 03/27/18   Willy Eddy, MD  ibuprofen (ADVIL,MOTRIN) 200 MG tablet Take 200 mg by mouth every 6 (six) hours as needed for moderate pain.    [provider]  Multiple Vitamin (MULTIVITAMIN) tablet Take 1 tablet by mouth daily.    [provider]  naproxen (NAPROSYN) 500 MG tablet Take 1 tablet (500 mg total) by mouth 2 (two) times daily with a meal.  03/27/18 03/27/19  Willy Eddy, MD  oxyCODONE (OXY IR/ROXICODONE) 5 MG immediate release tablet Take 5 mg by mouth every 6 (six) hours as needed for moderate pain.  06/13/17   [provider]  oxyCODONE-acetaminophen (PERCOCET/ROXICET) 5-325 MG tablet Take 1 tablet by mouth every 6 (six) hours as needed. 04/03/18   Eyvonne Mechanic, PA-C    Family History Family History  Problem Relation Age of Onset  . Depression Mother     Social History Social History   Tobacco Use  . Smoking status: Current Every Day Smoker    Packs/day: 1.50    Years: 14.00    Pack years: 21.00    Types: Cigarettes  . Smokeless  tobacco: Never Used  Substance Use Topics  . Alcohol use: Yes    Alcohol/week: 6.0 standard drinks    Types: 6 Cans of beer per week  . Drug use: Yes    Types: Marijuana     Allergies   Patient has no known allergies.   Review of Systems Review of Systems  All other systems reviewed and are negative.    Physical Exam Updated Vital Signs BP (!) 138/125 (BP Location: Right Arm)   Pulse 96   Temp 98.5 F (36.9 C)   Resp (!) 26   Ht  (1.854 m)   Wt 90.7 kg   SpO2 100%   BMI 26.39 kg/m   Physical Exam Vitals signs and nursing note reviewed.  Constitutional:      Appearance: He is well-developed.  HENT:     Head: Normocephalic and atraumatic.  Eyes:     General: No scleral icterus.       Right eye: No discharge.        Left eye: No discharge.     Conjunctiva/sclera: Conjunctivae normal.     Pupils: Pupils are equal, round, and reactive to light.  Neck:     Musculoskeletal: Normal range of motion.     Vascular: No JVD.     Trachea: No tracheal deviation.  Pulmonary:     Effort: Pulmonary effort is normal.     Breath sounds: No stridor.  Musculoskeletal:     Comments: Exquisite tenderness palpation of the dorsal foot through the toes-cap refill intact, no lacerations or open fractures-ankle nontender, tib-fib/ knee nontender  Neurological:     Mental Status: He is alert and oriented to person, place, and time.     Coordination: Coordination normal.  Psychiatric:        Behavior: Behavior normal.        Thought Content: Thought content normal.        Judgment: Judgment normal.        ED Treatments / Results  Labs (all labs ordered are listed, but only abnormal results are displayed) Labs Reviewed - No data to display  EKG None  Radiology Dg Foot Complete Right  Result Date: 04/03/2018 CLINICAL DATA:  Forklift ran over foot with pain, initial encounter EXAM: RIGHT FOOT COMPLETE - 3+ VIEW COMPARISON:  None. FINDINGS: Oblique fractures of the  distal aspect of the second through fourth metatarsals are seen with mild displacement laterally. No other fracture is seen. Tarsal degenerative changes are noted. IMPRESSION: Fractures of the second through fourth metatarsals. Electronically Signed   By: Alcide Clever M.D.   On: 04/03/2018 14:18    Procedures Procedures (including critical care time)  Medications Ordered in ED Medications  HYDROmorphone (DILAUDID) injection 0.5 mg (0.5 mg Intravenous Given 04/03/18 1349)  HYDROmorphone (  DILAUDID) injection 0.5 mg (0.5 mg Intravenous Given 04/03/18 1354)  HYDROmorphone (DILAUDID) injection 0.5 mg (0.5 mg Intravenous Given 04/03/18 1452)     Initial Impression / Assessment and Plan / ED Course  I have reviewed the triage vital signs and the nursing notes.  Pertinent labs & imaging results that were available during my care of the patient were reviewed by me and considered in my medical decision making (see chart for details).        Labs:   Imaging:  Consults:  Therapeutics: Dilaudid  Discharge Meds:   Assessment/Plan: 33 year old male presents today with metatarsal fractures.  No open wounds, no compartment syndrome.  Patient was given Dilaudid here.  Orthopedics consulted who admitted patient at bedside, recommend postop shoe, crutches, discharged with outpatient orthopedic follow-up.  Patient discharged with Percocet.  He does have a history of drug abuse previously but given the current fracture I do find it reasonable to give him a short course of pain medicine.  Patient encouraged follow-up as an outpatient, return precautions given.  I discussed signs and symptoms of compartment syndrome and immediate return precautions.  He verbalized understanding and agreement to today's plan had no further questions or concerns.    Final Clinical Impressions(s) / ED Diagnoses   Final diagnoses:  Closed displaced fracture of metatarsal bone of right foot, unspecified metatarsal, initial  encounter    ED Discharge Orders         Ordered    oxyCODONE-acetaminophen (PERCOCET/ROXICET) 5-325 MG tablet  Every 6 hours PRN     04/03/18 1448           Eyvonne Mechanic, PA-C 04/03/18 1458    Gerhard Munch, MD 04/04/18 2038

## 2018-04-03 NOTE — Discharge Instructions (Signed)
Ice to foot 20 minutes 4x/day.  Keep foot elevated whenever possible (toes above nose).  You may put weight through your heel with the post-op shoe on (it may be off when you aren't walking).  Watch for signs of compartment syndrome (see handout) and return to the ED if present.

## 2018-04-12 ENCOUNTER — Encounter: Payer: Self-pay | Admitting: Student

## 2018-04-12 DIAGNOSIS — S92342A Displaced fracture of fourth metatarsal bone, left foot, initial encounter for closed fracture: Secondary | ICD-10-CM | POA: Insufficient documentation

## 2018-04-12 DIAGNOSIS — S92322A Displaced fracture of second metatarsal bone, left foot, initial encounter for closed fracture: Secondary | ICD-10-CM | POA: Insufficient documentation

## 2018-04-12 DIAGNOSIS — S92332A Displaced fracture of third metatarsal bone, left foot, initial encounter for closed fracture: Secondary | ICD-10-CM | POA: Insufficient documentation

## 2018-04-19 ENCOUNTER — Ambulatory Visit (INDEPENDENT_AMBULATORY_CARE_PROVIDER_SITE_OTHER): Payer: 59 | Admitting: Psychiatry

## 2018-04-19 ENCOUNTER — Other Ambulatory Visit: Payer: Self-pay

## 2018-04-19 ENCOUNTER — Encounter (HOSPITAL_COMMUNITY): Payer: Self-pay | Admitting: Psychiatry

## 2018-04-19 VITALS — BP 124/76 | HR 68 | Ht 70.5 in | Wt 202.0 lb

## 2018-04-19 DIAGNOSIS — F1020 Alcohol dependence, uncomplicated: Secondary | ICD-10-CM | POA: Diagnosis not present

## 2018-04-19 DIAGNOSIS — F331 Major depressive disorder, recurrent, moderate: Secondary | ICD-10-CM | POA: Diagnosis not present

## 2018-04-19 DIAGNOSIS — F1121 Opioid dependence, in remission: Secondary | ICD-10-CM | POA: Diagnosis not present

## 2018-04-19 MED ORDER — PAROXETINE HCL 20 MG PO TABS: 20.0000 mg | ORAL_TABLET | Freq: Every day | ORAL | 0 refills | Status: DC

## 2018-04-19 MED ORDER — QUETIAPINE FUMARATE 50 MG PO TABS: 50.0000 mg | ORAL_TABLET | Freq: Two times a day (BID) | ORAL | 0 refills | Status: DC

## 2018-04-19 MED ORDER — NALTREXONE HCL 50 MG PO TABS: 50.0000 mg | ORAL_TABLET | Freq: Every day | ORAL | 0 refills | Status: DC
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
## 2018-04-20 ENCOUNTER — Ambulatory Visit (HOSPITAL_COMMUNITY): Payer: Self-pay | Admitting: Psychiatry

## 2018-05-01 ENCOUNTER — Ambulatory Visit (HOSPITAL_COMMUNITY): Payer: Self-pay | Admitting: Psychiatry

## 2018-05-15 ENCOUNTER — Ambulatory Visit (HOSPITAL_COMMUNITY): Payer: Self-pay | Admitting: Psychiatry

## 2018-05-17 ENCOUNTER — Other Ambulatory Visit: Payer: Self-pay

## 2018-05-17 ENCOUNTER — Ambulatory Visit (INDEPENDENT_AMBULATORY_CARE_PROVIDER_SITE_OTHER): Payer: 59 | Admitting: Psychiatry

## 2018-05-17 DIAGNOSIS — F1121 Opioid dependence, in remission: Secondary | ICD-10-CM | POA: Insufficient documentation

## 2018-05-17 DIAGNOSIS — F330 Major depressive disorder, recurrent, mild: Secondary | ICD-10-CM | POA: Diagnosis not present

## 2018-05-17 DIAGNOSIS — F1021 Alcohol dependence, in remission: Secondary | ICD-10-CM | POA: Diagnosis not present

## 2018-05-17 MED ORDER — PAROXETINE HCL 30 MG PO TABS: 30.0000 mg | ORAL_TABLET | Freq: Every day | ORAL | 0 refills | Status: DC

## 2018-05-17 MED ORDER — QUETIAPINE FUMARATE 50 MG PO TABS: 50.0000 mg | ORAL_TABLET | Freq: Every day | ORAL | 0 refills | Status: DC

## 2018-05-17 NOTE — Progress Notes (Signed)
BH MD/PA/NP OP Progress Note  05/17/2018 10:43 AM [name]  MRN:  [id number] Interview was conducted using teleconferencing and I verified that I was speaking with the correct person using two identifiers. I discussed the limitations of evaluation and management by telemedicine and  the availability of in person appointments. Patient expressed understanding and agreed to proceed.  Chief Complaint: "I'm doing really well"  HPI:  33 yo divorced male with hx of major depression (doubtful bipolar depression as no clear hx of manic episodes could be elicited), alcohol use disorder and opioid use disorder, the latter in sustained remission. He is under a lot of stress related to relationship issues and job loss. He has tried few medications in the past with Paxil being most effective. He has been having long standing problems with disturbed sleep. During initial visit he was depressed, anxious, passively suicidal, gets irritable/dysphoric when drunk but did not have any alcohol for the past 4 days now and hopes to stay sober.He has been started on 20 mg of paroxetine and 50 mg of quetiapine. Mood much improived, ninimally depressed. Sleep is now normal. No cravings for alcohol - remains sober despite not filling rx for naltrexone (could not afford it). NO SI.  Visit Diagnosis:    ICD-10-CM   1. Alcohol use disorder, severe, in early remission (HCC) F10.21   2. Mild episode of recurrent major depressive disorder (HCC) F33.0   3. Opioid use disorder, moderate, in sustained remission (HCC) F11.21     Past Psychiatric History: Please refer to intake H&P.  Past Medical History:  Past Medical History:  Diagnosis Date  . Anxiety   . Asthma    as a child  . Bipolar disorder (HCC)    "supposed to take Latuda, but it is too expensive"  . Closed head injury with concussion   . Complication of anesthesia    "I woke up from surgery in April (23, 2019) very mean"  . Depression   . History of  kidney stones     passed x2 lithotripsy    Past Surgical History:  Procedure Laterality Date  . AMPUTATION Right 05/24/2017   Procedure: LITTLE FINGER REPAIR EXTENSOR AND FLEXOR TENDON, LATERAL LIGAMENT REPAIR, NERVE REPAIR, JOINT PINNING;  Surgeon: Dominica Severin, MD;  Location: [facility] OR;  Service: Orthopedics;  Laterality: Right;  . CHOLECYSTECTOMY    . CYSTOSCOPY W/ STONE MANIPULATION    . FOOT SURGERY Right    fracture  . HARDWARE REMOVAL Right 07/12/2017   Procedure: Right small finger hardware removal, pin removal;  Surgeon: Dominica Severin, MD;  Location: [facility] OR;  Service: Orthopedics;  Laterality: Right;  45 mins  . LITHOTRIPSY      Family Psychiatric History: Reviewed.  Family History:  Family History  Problem Relation Age of Onset  . Depression Mother   . Alcohol abuse Maternal Uncle   . Depression Maternal Uncle     Social History:  Social History   Socioeconomic History  . Marital status: Legally Separated    Spouse name: Not on file  . Number of children: Not on file  . Years of education: Not on file  . Highest education level: Not on file  Occupational History  . Not on file  Social Needs  . Financial resource strain: Not on file  . Food insecurity:    Worry: Not on file    Inability: Not on file  . Transportation needs:    Medical: Not on file    Non-medical:  Not on file  Tobacco Use  . Smoking status: Current Every Day Smoker    Packs/day: 1.50    Years: 14.00    Pack years: 21.00    Types: Cigarettes  . Smokeless tobacco: Never Used  . Tobacco comment: Reports has cut back to 1 pk a day, has Chantix but has not started.   Substance and Sexual Activity  . Alcohol use: Yes    Alcohol/week: 42.0 standard drinks    Types: 42 Cans of beer per week    Comment: 5 days clean, 6 pack a day prior to that time.   . Drug use: Yes    Frequency: 7.0 times per week    Types: Marijuana  . Sexual activity: Yes    Partners: Female    Birth control/protection:  None  Lifestyle  . Physical activity:    Days per week: Not on file    Minutes per session: Not on file  . Stress: Not on file  Relationships  . Social connections:    Talks on phone: Not on file    Gets together: Not on file    Attends religious service: Not on file    Active member of club or organization: Not on file    Attends meetings of clubs or organizations: Not on file    Relationship status: Not on file  Other Topics Concern  . Not on file  Social History Narrative  . Not on file    Allergies: No Known Allergies  Metabolic Disorder Labs: No results found for: HGBA1C, MPG No results found for: PROLACTIN No results found for: CHOL, TRIG, HDL, CHOLHDL, VLDL, LDLCALC No results found for: TSH  Therapeutic Level Labs: No results found for: LITHIUM No results found for: VALPROATE No components found for:  CBMZ  Current Medications: Current Outpatient Medications  Medication Sig Dispense Refill  . acetaminophen (TYLENOL) 325 MG tablet Take 650 mg by mouth every 6 (six) hours as needed for mild pain.    Marland Kitchen albuterol (PROVENTIL HFA;VENTOLIN HFA) 108 (90 Base) MCG/ACT inhaler Inhale 2 puffs into the lungs every 6 (six) hours as needed for wheezing or shortness of breath. (Patient not taking: Reported on 04/19/2018) 1 Inhaler 2  . cyclobenzaprine (FLEXERIL) 10 MG tablet Take 1 tablet (10 mg total) by mouth 3 (three) times daily as needed for muscle spasms. (Patient not taking: Reported on 04/19/2018) 12 tablet 0  . ibuprofen (ADVIL,MOTRIN) 200 MG tablet Take 200 mg by mouth every 6 (six) hours as needed for moderate pain.    . Multiple Vitamin (MULTIVITAMIN) tablet Take 1 tablet by mouth daily.    . naltrexone (DEPADE) 50 MG tablet Take 1 tablet (50 mg total) by mouth daily for 30 days. 30 tablet 0  . naproxen (NAPROSYN) 500 MG tablet Take 1 tablet (500 mg total) by mouth 2 (two) times daily with a meal. 20 tablet 0  . oxyCODONE-acetaminophen (PERCOCET/ROXICET) 5-325 MG tablet  Take 1 tablet by mouth every 6 (six) hours as needed. (Patient not taking: Reported on 04/19/2018) 15 tablet 0  . PARoxetine (PAXIL) 20 MG tablet Take 1 tablet (20 mg total) by mouth at bedtime for 30 days. 30 tablet 0  . QUEtiapine (SEROQUEL) 50 MG tablet Take 1 tablet (50 mg total) by mouth 2 (two) times daily for 30 days. 60 tablet 0   No current facility-administered medications for this visit.      Musculoskeletal:  Psychiatric Specialty Exam: Review of Systems  Psychiatric/Behavioral: Positive for depression.  All other systems reviewed and are negative.   There were no vitals taken for this visit.There is no height or weight on file to calculate BMI.  General Appearance: NA  Eye Contact:  NA  Speech:  Clear and Coherent  Volume:  Normal  Mood:  mild depression  Affect:  NA  Thought Process:  Goal Directed  Orientation:  Full (Time, Place, and Person)  Thought Content: Logical   Suicidal Thoughts:  No  Homicidal Thoughts:  No  Memory:  Immediate;   Good Recent;   Good Remote;   Good  Judgement:  Intact  Insight:  Fair  Psychomotor Activity:  NA  Concentration:  Concentration: Good  Recall:  Good  Fund of Knowledge: Good  Language: Good  Akathisia:  NA  Handed:  Right  AIMS (if indicated): not done  Assets:  Communication Skills Desire for Improvement Housing Physical Health Talents/Skills  ADL's:  Intact  Cognition: WNL  Sleep:  Good   Screenings: AIMS     [facility]  AIMS Total Score  0    AUDIT     [facility]  Alcohol Use Disorder Identification Test Final Score (AUDIT)  30       Assessment and Plan: 33 yo divorced male with hx of major depression (doubtful bipolar depression as no clear hx of manic episodes could be elicited), alcohol use disorder and opioid use disorder, the latter in sustained remission. He is under a  lot of stress related to relationship issues and job loss. He has tried few medications in the past with Paxil being most effective (was up to 40 mg dose). He has been having long standing problems with disturbed sleep. During initial visit he was depressed, anxious, passively suicidal, gets irritable/dysphoric when drunk but did not have any alcohol for the past 4 days now and hopes to stay sober.He has been started on 20 mg of paroxetine and 50 mg of quetiapine. Mood much improived, ninimally depressed. Sleep is now normal. No cravings for alcohol - remains sober despite not filling rx for naltrexone (could not afford it). No SI. Plan: Continue Seroquel 50 mg at HS, increase Paxil to 30 mg daily. Return to clinic in 3 months.   Magdalene Patricialgierd A [place], MD 05/17/2018, 10:43 AM

## 2018-08-13 ENCOUNTER — Other Ambulatory Visit: Payer: Self-pay

## 2018-08-13 ENCOUNTER — Emergency Department
Admission: EM | Admit: 2018-08-13 | Discharge: 2018-08-15 | Disposition: A | Discharge status: Other Acute Inpatient Hospital | Payer: Self-pay | Attending: Emergency Medicine | Admitting: Emergency Medicine

## 2018-08-13 DIAGNOSIS — R45851 Suicidal ideations: Secondary | ICD-10-CM | POA: Insufficient documentation

## 2018-08-13 DIAGNOSIS — Z20828 Contact with and (suspected) exposure to other viral communicable diseases: Secondary | ICD-10-CM | POA: Insufficient documentation

## 2018-08-13 DIAGNOSIS — F17210 Nicotine dependence, cigarettes, uncomplicated: Secondary | ICD-10-CM | POA: Insufficient documentation

## 2018-08-13 DIAGNOSIS — F329 Major depressive disorder, single episode, unspecified: Secondary | ICD-10-CM

## 2018-08-13 DIAGNOSIS — Z79899 Other long term (current) drug therapy: Secondary | ICD-10-CM | POA: Insufficient documentation

## 2018-08-13 DIAGNOSIS — F32A Depression, unspecified: Secondary | ICD-10-CM

## 2018-08-13 DIAGNOSIS — F319 Bipolar disorder, unspecified: Secondary | ICD-10-CM | POA: Insufficient documentation

## 2018-08-13 DIAGNOSIS — J45909 Unspecified asthma, uncomplicated: Secondary | ICD-10-CM | POA: Insufficient documentation

## 2018-08-13 LAB — CBC
HCT: 41.1 % (ref 39.0–52.0)
Hemoglobin: 14.1 g/dL (ref 13.0–17.0)
MCH: 29.7 pg (ref 26.0–34.0)
MCHC: 34.3 g/dL (ref 30.0–36.0)
MCV: 86.7 fL (ref 80.0–100.0)
Platelets: 224 10*3/uL (ref 150–400)
RBC: 4.74 MIL/uL (ref 4.22–5.81)
RDW: 13.1 % (ref 11.5–15.5)
WBC: 8.7 10*3/uL (ref 4.0–10.5)
nRBC: 0 % (ref 0.0–0.2)

## 2018-08-13 LAB — URINE DRUG SCREEN, QUALITATIVE (ARMC ONLY)
Amphetamines, Ur Screen: NOT DETECTED
Barbiturates, Ur Screen: NOT DETECTED
Benzodiazepine, Ur Scrn: NOT DETECTED
Cannabinoid 50 Ng, Ur ~~LOC~~: POSITIVE — AB
Cocaine Metabolite,Ur ~~LOC~~: POSITIVE — AB
MDMA (Ecstasy)Ur Screen: NOT DETECTED
Methadone Scn, Ur: NOT DETECTED
Opiate, Ur Screen: NOT DETECTED
Phencyclidine (PCP) Ur S: NOT DETECTED
Tricyclic, Ur Screen: NOT DETECTED

## 2018-08-13 LAB — COMPREHENSIVE METABOLIC PANEL
ALT: 20 U/L (ref 0–44)
AST: 29 U/L (ref 15–41)
Albumin: 3.8 g/dL (ref 3.5–5.0)
Alkaline Phosphatase: 72 U/L (ref 38–126)
Anion gap: 7 (ref 5–15)
BUN: 13 mg/dL (ref 6–20)
CO2: 26 mmol/L (ref 22–32)
Calcium: 8.4 mg/dL — ABNORMAL LOW (ref 8.9–10.3)
Chloride: 107 mmol/L (ref 98–111)
Creatinine, Ser: 0.86 mg/dL (ref 0.61–1.24)
GFR calc Af Amer: >60 mL/min (ref >60)
GFR calc non Af Amer: >60 mL/min (ref >60)
Glucose, Bld: 94 mg/dL (ref 70–99)
Potassium: 3.5 mmol/L (ref 3.5–5.1)
Sodium: 140 mmol/L (ref 135–145)
Total Bilirubin: 0.8 mg/dL (ref 0.3–1.2)
Total Protein: 6.4 g/dL — ABNORMAL LOW (ref 6.5–8.1)

## 2018-08-13 LAB — SALICYLATE LEVEL: Salicylate Lvl: <7 mg/dL (ref 2.8–30.0)

## 2018-08-13 LAB — ETHANOL: Alcohol, Ethyl (B): <10 mg/dL (ref <10)

## 2018-08-13 LAB — ACETAMINOPHEN LEVEL: Acetaminophen (Tylenol), Serum: <10 ug/mL — ABNORMAL LOW (ref 10–30)

## 2018-08-13 NOTE — BH Assessment (Signed)
Assessment Note  [name] is an 33 y.o. male. Who reports voluntarily  to the ER. He self repots a history of Bipolar Disorder. When asked to describe primary reason for presentation patient states that he is seeking help with his mood and alcoholism. He reports drinking heavily for the past 3 months. Patient reports drinking daily, approximately a 12-pack of Beer a day. He reports the history of detox in the distant past and states that he's not actively suicidal but he does have thoughts of not wanting to be here.He denied any previous attempts of suicide or suicidal ideation He reports "I've been on bad times." She explains that his fiance urged him to come in on today  According to the patient he has been feeling more depressed lately has been resorting to using substances once again such as Suboxone and has been drinking alcohol heavily on a daily basis. Marland Kitchen. He reports feeling depressed and experiencing the deflated move for the past few months. He states that approximately three months ago he began to participate [facility]. Patient reports he was prescribed Paxil and Seroquel although he reports non-compliance and states that he now is seeking medication assistance for his mood and cravings.Pt. denies any current suicidal ideation, plan or intent. Pt. denies the presence of any auditory or visual hallucinations at this time. Patient denies any other medical complaints.   Diagnosis: Bipolar Disorder.  Past Medical History:  Past Medical History:  Diagnosis Date  . Anxiety   . Asthma    as a child  . Bipolar disorder (HCC)    "supposed to take Latuda, but it is too expensive"  . Closed head injury with concussion   . Complication of anesthesia    "I woke up from surgery in April (23, 2019) very mean"  . Depression   . History of kidney stones     passed x2 lithotripsy    Past Surgical History:  Procedure Laterality  Date  . AMPUTATION Right 05/24/2017   Procedure: LITTLE FINGER REPAIR EXTENSOR AND FLEXOR TENDON, LATERAL LIGAMENT REPAIR, NERVE REPAIR, JOINT PINNING;  Surgeon: Dominica SeverinGramig, William, MD;  Location: [facility] OR;  Service: Orthopedics;  Laterality: Right;  . CHOLECYSTECTOMY    . CYSTOSCOPY W/ STONE MANIPULATION    . FOOT SURGERY Right    fracture  . HARDWARE REMOVAL Right 07/12/2017   Procedure: Right small finger hardware removal, pin removal;  Surgeon: Dominica SeverinGramig, William, MD;  Location: [facility] OR;  Service: Orthopedics;  Laterality: Right;  45 mins  . LITHOTRIPSY      Family History:  Family History  Problem Relation Age of Onset  . Depression Mother   . Alcohol abuse Maternal Uncle   . Depression Maternal Uncle     Social History:  reports that he has been smoking cigarettes. He has a 21.00 pack-year smoking history. He has never used smokeless tobacco. He reports current alcohol use of about 42.0 standard drinks of alcohol per week. He reports current drug use. Frequency: 7.00 times per week. Drug: Marijuana.  Additional Social History:  Alcohol / Drug Use Pain Medications: SEE PTA Prescriptions: SEE PTA Over the Counter: SEE PTA History of alcohol / drug use?: Yes Substance #1 Name of Substance 1: Alcohol 1 - Age of First Use: 33 y.o. 1 - Amount (size/oz): several beers 1 - Frequency: daily 1 - Duration: 3 month 1 - Last Use / Amount: yesterday, 6 pack  CIWA: CIWA-Ar BP: 140/82 Pulse  Rate: 78 COWS:    Allergies: No Known Allergies  Home Medications: (Not in a hospital admission)   OB/GYN Status:  No LMP for male patient.  General Assessment Data Location of Assessment: [facility] ED TTS Assessment: In system Is this a Tele or Face-to-Face Assessment?: Tele Assessment Is this an Initial Assessment or a Re-assessment for this encounter?: Initial Assessment Patient Accompanied by:: N/A Living Arrangements: Other (Comment) What gender do you identify as?: Male Marital status: Long term  relationship Living Arrangements: Spouse/significant other Can pt return to current living arrangement?: Yes Admission Status: Voluntary Is patient capable of signing voluntary admission?: Yes Referral Source: Self/Family/Friend Insurance type: None   Medical Screening Exam (Long) Medical Exam completed: Yes  Crisis Care Plan Living Arrangements: Spouse/significant other Legal Guardian: Other:(none) Name of Psychiatrist: [facility](medicaiton management ) Name of Therapist: none  Education Status Is patient currently in school?: No Is the patient employed, unemployed or receiving disability?: Employed  Risk to self with the past 6 months Suicidal Ideation: No-Not Currently/Within Last 6 Months Suicidal Intent: No Has patient had any suicidal intent within the past 6 months prior to admission? : No Is patient at risk for suicide?: Yes Suicidal Plan?: No Has patient had any suicidal plan within the past 6 months prior to admission? : No What has been your use of drugs/alcohol within the last 12 months?: Alcohol and THC  Previous Attempts/Gestures: No How many times?: 0 Other Self Harm Risks: none  Triggers for Past Attempts: Other (Comment)(none) Intentional Self Injurious Behavior: None Family Suicide History: Yes(Uncle ) Recent stressful life event(s): Financial Problems Persecutory voices/beliefs?: No Depression: Yes Depression Symptoms: Guilt, Insomnia, Feeling angry/irritable, Loss of interest in usual pleasures Substance abuse history and/or treatment for substance abuse?: Yes Suicide prevention information given to non-admitted patients: Yes  Risk to Others within the past 6 months Homicidal Ideation: No Does patient have any lifetime risk of violence toward others beyond the six months prior to admission? : No Thoughts of Harm to Others: No Current Homicidal Intent: No Current Homicidal Plan: No Access to Homicidal Means: No Identified Victim: (none  ) History of harm to others?: No Assessment of Violence: None Noted Violent Behavior Description: none  Does patient have access to weapons?: No Criminal Charges Pending?: No Does patient have a court date: No Is patient on probation?: No  Psychosis Hallucinations: None noted Delusions: None noted  Mental Status Report Appearance/Hygiene: In scrubs Eye Contact: Fair Motor Activity: Freedom of movement Speech: Slow Level of Consciousness: Alert Mood: Depressed Affect: Anxious, Sad Anxiety Level: Minimal Thought Processes: Coherent Judgement: Partial Orientation: Place, Time, Situation, Person Obsessive Compulsive Thoughts/Behaviors: None  Cognitive Functioning Concentration: Good Memory: Remote Intact, Recent Intact Is patient IDD: No Insight: Fair Impulse Control: Fair Appetite: Fair Have you had any weight changes? : No Change Sleep: Decreased Total Hours of Sleep: 4 Vegetative Symptoms: None  ADLScreening [facility] Assessment Services) Patient's cognitive ability adequate to safely complete daily activities?: Yes Patient able to express need for assistance with ADLs?: Yes Independently performs ADLs?: Yes (appropriate for developmental age)  Prior Inpatient Therapy Prior Inpatient Therapy: Yes Prior Therapy Dates: 2009 Prior Therapy Facilty/Provider(s): [facility] Reason for Treatment: Depression and SA  Prior Outpatient Therapy Prior Outpatient Therapy: Yes Prior Therapy Dates: 05/2018 Prior Therapy Facilty/Provider(s): [facility]  Reason for Treatment: Bipolar Disoprder and SA Does patient have an ACCT team?: No Does patient have Intensive In-House Services?  : No Does patient have [facility] services? : No Does  patient have P4CC services?: No  ADL Screening (condition at time of admission) Patient's cognitive ability adequate to safely complete daily activities?: Yes Patient able to express need for assistance with ADLs?: Yes Independently performs ADLs?: Yes  (appropriate for developmental age)       Abuse/Neglect Assessment (Assessment to be complete while patient is alone) Abuse/Neglect Assessment Can Be Completed: Yes Physical Abuse: Denies Verbal Abuse: Denies Sexual Abuse: Denies Exploitation of patient/patient's resources: Denies Self-Neglect: Denies Values / Beliefs Cultural Requests During Hospitalization: None Spiritual Requests During Hospitalization: None Consults Spiritual Care Consult Needed: No Social Work Consult Needed: No Merchant navy officerAdvance Directives (For Healthcare) Does Patient Have a Medical Advance Directive?: No          Disposition:  Disposition Initial Assessment Completed for this Encounter: Yes  On Site Evaluation by:   Reviewed with Physician:    Asa SaunasShawanna N Callan Yontz 08/13/2018 9:29 PM

## 2018-08-13 NOTE — ED Notes (Signed)
-   Grey sandals - Blue Underwear - [employer] - White T-shirt - Black socks - Blue note book  - Wallet and keys given to PD

## 2018-08-13 NOTE — ED Provider Notes (Signed)
[facility] Emergency Department Provider Note  Time seen: 7:47 PM  I have reviewed the triage vital signs and the nursing notes.   HISTORY  Chief Complaint Psychiatric Evaluation   HPI Justin LopesJonathan S Donovan is a 33 y.o. male with a past medical history of anxiety, asthma, bipolar, substance abuse, presents to the emergency department for depression and substance abuse.  According to the patient he has been feeling more depressed lately has been resorting to using substances once again such as Suboxone and has been drinking alcohol heavily on a daily basis.  Patient states his fiance is threatening to leave him unless he gets clean, states this is worsened his depression, states fleeting suicidal thoughts but denies any active plan to do so.  Patient denies any medical concerns today.   Past Medical History:  Diagnosis Date  . Anxiety   . Asthma    as a child  . Bipolar disorder (HCC)    "supposed to take Latuda, but it is too expensive"  . Closed head injury with concussion   . Complication of anesthesia    "I woke up from surgery in April (23, 2019) very mean"  . Depression   . History of kidney stones     passed x2 lithotripsy    Patient Active Problem List   Diagnosis Date Noted  . Opioid use disorder, moderate, in sustained remission (HCC) 05/17/2018  . Moderate episode of recurrent major depressive disorder (HCC) 04/19/2018  . Forklift accident, initial encounter 04/12/2018  . Closed displaced fracture of second metatarsal bone of left foot 04/12/2018  . Closed displaced fracture of third metatarsal bone of left foot 04/12/2018  . Closed displaced fracture of fourth metatarsal bone of left foot 04/12/2018  . Status post surgery 05/24/2017  . Laceration of finger, right 05/24/2017  . Opioid use disorder, severe, dependence (HCC) 03/26/2015  . Alcohol use disorder, severe, dependence (HCC) 03/26/2015  . Tobacco use disorder 03/26/2015  .  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 AMPUTATION Right 05/24/2017   Procedure: LITTLE FINGER REPAIR EXTENSOR AND FLEXOR TENDON, LATERAL LIGAMENT REPAIR, NERVE REPAIR, JOINT PINNING;  Surgeon: Dominica SeverinGramig, William, MD;  Location: [facility] OR;  Service: Orthopedics;  Laterality: Right;  . CHOLECYSTECTOMY    . CYSTOSCOPY W/ STONE MANIPULATION    . FOOT SURGERY Right    fracture  . HARDWARE REMOVAL Right 07/12/2017   Procedure: Right small finger hardware removal, pin removal;  Surgeon: Dominica SeverinGramig, William, MD;  Location: [facility] OR;  Service: Orthopedics;  Laterality: Right;  45 mins  . LITHOTRIPSY      Prior to Admission medications   Medication Sig Start Date End Date Taking? Authorizing Provider  acetaminophen (TYLENOL) 325 MG tablet Take 650 mg by mouth every 6 (six) hours as needed for mild pain.    [provider]  albuterol (PROVENTIL HFA;VENTOLIN HFA) 108 (90 Base) MCG/ACT inhaler Inhale 2 puffs into the lungs every 6 (six) hours as needed for wheezing or shortness of breath. Patient not taking: Reported on 04/19/2018 03/27/18   Willy Eddyobinson, Patrick, MD  cyclobenzaprine (FLEXERIL) 10 MG tablet Take 1 tablet (10 mg total) by mouth 3 (three) times daily as needed for muscle spasms. Patient not taking: Reported on 04/19/2018 03/27/18   Willy Eddyobinson, Patrick, MD  ibuprofen (ADVIL,MOTRIN) 200 MG tablet Take 200 mg by mouth every 6 (six) hours as needed for moderate  pain.    [provider]  Multiple Vitamin (MULTIVITAMIN) tablet Take 1 tablet by mouth daily.    [provider]  naproxen (NAPROSYN) 500 MG tablet Take 1 tablet (500 mg total) by mouth 2 (two) times daily with a meal. 03/27/18 03/27/19  Willy Eddyobinson, Patrick, MD  oxyCODONE-acetaminophen (PERCOCET/ROXICET) 5-325 MG tablet Take 1 tablet by mouth every 6 (six) hours as needed. Patient not taking: Reported on  04/19/2018 04/03/18   Hedges, Tinnie GensJeffrey, PA-C  PARoxetine (PAXIL) 30 MG tablet Take 1 tablet (30 mg total) by mouth daily. 05/17/18 08/15/18  Pucilowski, Roosvelt Maserlgierd A, MD  QUEtiapine (SEROQUEL) 50 MG tablet Take 1 tablet (50 mg total) by mouth at bedtime. 05/17/18 08/15/18  Pucilowski, Roosvelt Maserlgierd A, MD    No Known Allergies  Family History  Problem Relation Age of Onset  . Depression Mother   . Alcohol abuse Maternal Uncle   . Depression Maternal Uncle     Social History Social History   Tobacco Use  . Smoking status: Current Every Day Smoker    Packs/day: 1.50    Years: 14.00    Pack years: 21.00    Types: Cigarettes  . Smokeless tobacco: Never Used  . Tobacco comment: Reports has cut back to 1 pk a day, has Chantix but has not started.   Substance Use Topics  . Alcohol use: Yes    Alcohol/week: 42.0 standard drinks    Types: 42 Cans of beer per week    Comment: 5 days clean, 6 pack a day prior to that time.   . Drug use: Yes    Frequency: 7.0 times per week    Types: Marijuana    Review of Systems Constitutional: Negative for fever. Cardiovascular: Negative for chest pain. Respiratory: Negative for shortness of breath. Gastrointestinal: Negative for abdominal pain Musculoskeletal: Negative for musculoskeletal complaints Skin: Negative for skin complaints  Neurological: Negative for headache All other ROS negative  ____________________________________________   PHYSICAL EXAM:  VITAL SIGNS: ED Triage Vitals  Enc Vitals Group     BP 08/13/18 1845 140/82     Pulse Rate 08/13/18 1845 78     Resp 08/13/18 1845 18     Temp 08/13/18 1845 99 F (37.2 C)     Temp Source 08/13/18 1845 Oral     SpO2 08/13/18 1845 96 %     Weight 08/13/18 1908 215 lb (97.5 kg)     Height 08/13/18 1908 6\' 1"  (1.854 m)     Head Circumference --      Peak Flow --      Pain Score 08/13/18 1907 3     Pain Loc --      Pain Edu? --      Excl. in GC? --    Constitutional: Alert and oriented. Well  appearing and in no distress. Eyes: Normal exam ENT      Head: Normocephalic and atraumatic.      Mouth/Throat: Mucous membranes are moist. Cardiovascular: Normal rate, regular rhythm.  Respiratory: Normal respiratory effort without tachypnea nor retractions. Breath sounds are clear Gastrointestinal: Soft and nontender. No distention.   Musculoskeletal: Nontender with normal range of motion in all extremities.  Neurologic:  Normal speech and language. No gross focal neurologic deficits Skin:  Skin is warm, dry and intact.  Psychiatric: Flat affect  ____________________________________________   INITIAL IMPRESSION / ASSESSMENT AND PLAN / ED COURSE  Pertinent labs & imaging results that were available during my care of the patient were reviewed by me  and considered in my medical decision making (see chart for details).   Patient presents emergency department looking for help with substance abuse and depression.  Patient has a flat affect during exam, states from time to time he will have fleeting suicidal thoughts but denies any active suicidal thoughts or plan to do so.  We will check labs, have psychiatry and TTS see the patient.  At this time with no active SI or HI do not believe the patient meets IVC criteria but he is willing to stay voluntarily hoping to get help.  Lab work largely [facility] besides cocaine and cannabinoid positive urine toxicology.  BURREL LEGRAND was evaluated in Emergency Department on 08/13/2018 for the symptoms described in the history of present illness. He was evaluated in the context of the global COVID-19 pandemic, which necessitated consideration that the patient might be at risk for infection with the SARS-CoV-2 virus that causes COVID-19. Institutional protocols and algorithms that pertain to the evaluation of patients at risk for COVID-19 are in a state of rapid change based on information released by regulatory bodies including the CDC and federal  and state organizations. These policies and algorithms were followed during the patient's care in the ED.  ____________________________________________   FINAL CLINICAL IMPRESSION(S) / ED DIAGNOSES  Substance abuse Depression   Harvest Dark, MD 08/13/18 2215

## 2018-08-13 NOTE — ED Triage Notes (Signed)
"  Just trying to get some help."

## 2018-08-13 NOTE — ED Notes (Signed)
Pt said he's here for help because he has been drinking from the time he wakes up until the time he goes to bed. He reports his last drink was yesterday, and he denies any s/s of withdrawal. He also reports buying about 2 8 mg tabs of Suboxone off the street each week, cutting them into thirds for each use. He reports having quit using other opioids a few years ago and says he's using Suboxone to help keep him from using again. He reports SI but no plan, denies HI/AVH/aggressive behavior/paranoia. Pt appears depressed and makes no eye contact. He is calm/cooperative/polite. Swelling noted on left finger. Pt says he "cut it off" last year in an accident and does not know why it's swollen. Water provided. Pt denies immediate needs. Oriented to quad. Will continue to monitor for needs/safety.

## 2018-08-14 DIAGNOSIS — F319 Bipolar disorder, unspecified: Secondary | ICD-10-CM

## 2018-08-14 LAB — SARS CORONAVIRUS 2 BY RT PCR (HOSPITAL ORDER, PERFORMED IN ~~LOC~~ HOSPITAL LAB): SARS Coronavirus 2: NEGATIVE

## 2018-08-14 MED ORDER — THIAMINE HCL 100 MG/ML IJ SOLN: 100.0000 mg | Freq: Every day | INTRAMUSCULAR | Status: DC

## 2018-08-14 MED ORDER — LORAZEPAM 2 MG PO TABS
0.0000 mg | ORAL_TABLET | Freq: Four times a day (QID) | ORAL | Status: DC
  Administered 2018-08-14: 2 mg via ORAL
  Administered 2018-08-14: 22:00:00 1 mg via ORAL
  Administered 2018-08-15: 11:00:00 2 mg via ORAL
  Filled 2018-08-14 (×3): qty 1

## 2018-08-14 MED ORDER — LORAZEPAM 2 MG PO TABS
2.0000 mg | ORAL_TABLET | Freq: Once | ORAL | Status: AC
  Administered 2018-08-14: 01:00:00 2 mg via ORAL
  Filled 2018-08-14: qty 1

## 2018-08-14 MED ORDER — LORAZEPAM 2 MG PO TABS: 0.0000 mg | ORAL_TABLET | Freq: Two times a day (BID) | ORAL | Status: DC

## 2018-08-14 MED ORDER — PAROXETINE HCL 20 MG PO TABS
20.0000 mg | ORAL_TABLET | Freq: Every day | ORAL | Status: DC
  Administered 2018-08-15: 20 mg via ORAL
  Filled 2018-08-14: qty 1

## 2018-08-14 MED ORDER — LORAZEPAM 2 MG/ML IJ SOLN: 0.0000 mg | Freq: Four times a day (QID) | INTRAMUSCULAR | Status: DC

## 2018-08-14 MED ORDER — QUETIAPINE FUMARATE 25 MG PO TABS
50.0000 mg | ORAL_TABLET | Freq: Two times a day (BID) | ORAL | Status: DC
  Administered 2018-08-14 – 2018-08-15 (×2): 50 mg via ORAL
  Filled 2018-08-14 (×2): qty 2

## 2018-08-14 MED ORDER — LORAZEPAM 2 MG/ML IJ SOLN: 0.0000 mg | Freq: Two times a day (BID) | INTRAMUSCULAR | Status: DC

## 2018-08-14 MED ORDER — NICOTINE 21 MG/24HR TD PT24
21.0000 mg | MEDICATED_PATCH | Freq: Every day | TRANSDERMAL | Status: DC
  Administered 2018-08-15: 21 mg via TRANSDERMAL
  Filled 2018-08-14: qty 1

## 2018-08-14 MED ORDER — VITAMIN B-1 100 MG PO TABS
100.0000 mg | ORAL_TABLET | Freq: Every day | ORAL | Status: DC
  Administered 2018-08-15: 100 mg via ORAL
  Filled 2018-08-14: qty 1

## 2018-08-14 NOTE — ED Notes (Signed)
Pt transferred into ED BHU room 2    Patient assigned to appropriate care area. Patient oriented to unit/care area: Informed that, for his safety, care areas are designed for safety and monitored by security cameras at all times; phone times explained to patient. Patient verbalizes understanding, and verbal contract for safety obtained.   Assessment completed  Plan of care discussed including pending inpt treatment

## 2018-08-14 NOTE — ED Notes (Signed)
BEHAVIORAL HEALTH ROUNDING Patient sleeping: No. Patient alert and oriented: yes Behavior appropriate: Yes.  ; If no, describe:  Nutrition and fluids offered: yes Toileting and hygiene offered: Yes  Sitter present: q15 minute observations and security camera monitoring Law enforcement present: Yes  ODS  

## 2018-08-14 NOTE — ED Notes (Addendum)
Pt reports "my legs won't stop moving" pt appears with legs rubbing together, pt reports last ETOH "last night", last used suboxone   Dr Beather Arbour notified, orders rx'd

## 2018-08-14 NOTE — ED Notes (Signed)

## 2018-08-14 NOTE — ED Notes (Signed)
Sig other, Colletta [place], called at pt request to say "I'm doing good", pt has picture of pt and other at bedside, sig other reports "I'll call him in the morning"

## 2018-08-14 NOTE — ED Notes (Signed)
Dr Einar Grad speaking to patient on telepsych computer.

## 2018-08-14 NOTE — Consult Note (Signed)
Telepsych Consultation   Reason for Consult:  Suicidal thoughts, substance abuse with alcohol Referring Physician:  EDP Location of Patient:  Location [facility] Provider: [facility]  Patient Identification: Berline LopesJonathan S Olund MRN:  [id number] Principal Diagnosis: <principal problem not specified> Diagnosis:  Active Problems:   * No active hospital problems. *   Total Time spent with patient: 20 minutes  Subjective:   Berline LopesJonathan S Rammel is a 33 y.o. male patient admitted with suicidal thoughts, substance abuse with alcohol.  HPI:  Patient is a 33 yo male with history of bipolar disorder, alcohol abuse, Depression presents to the ED with suicidal thoughts and alcohol intoxication.  Patient reports that he has been feeling more depressed lately and has been drinking alcohol pretty heavily.  He is drinking about 12 cans of beer on a daily basis and also using Suboxone which he gets from the streets.  States that in the past he had problems with opiate pills but he has not been using recently.  He reports that his fiance has been threatening to leave him unless he gets clean and this has also worsened his depression.  He denies any active plan.  States that he was supposed to be taking Paxil and Seroquel but has not been taking for a while.  He was seeing a [facility] but is unable to recall the name of the physician.  He denies any psychotic symptoms.  He works as a Naval architecttruck driver.  States that he continues to have his job.  He also has a 34-year-old daughter.  Patient that became tearful talking about his child.  States that he wants to go to the inpatient unit and get cleaned up and restarted on his medications with a plan for his substance abuse.  Denies any active suicidal ideations.   Past Psychiatric History: Patient has previous history of being hospitalized [facility] behavioral [facility].  Risk to Self: Suicidal Ideation: No-Not  Currently/Within Last 6 Months Suicidal Intent: No Is patient at risk for suicide?: Yes Suicidal Plan?: No What has been your use of drugs/alcohol within the last 12 months?: Alcohol and THC  How many times?: 0 Other Self Harm Risks: none  Triggers for Past Attempts: Other (Comment)(none) Intentional Self Injurious Behavior: None Risk to Others: Homicidal Ideation: No Thoughts of Harm to Others: No Current Homicidal Intent: No Current Homicidal Plan: No Access to Homicidal Means: No Identified Victim: (none ) History of harm to others?: No Assessment of Violence: None Noted Violent Behavior Description: none  Does patient have access to weapons?: No Criminal Charges Pending?: No Does patient have a court date: No Prior Inpatient Therapy: Prior Inpatient Therapy: Yes Prior Therapy Dates: 2009 Prior Therapy [facility]ilty[facility] Reason for Treatment: Depression and SA Prior Outpatient Therapy: Prior Outpatient Therapy: Yes Prior Therapy Dates: 05/2018 Prior Therapy Facilty/Provider(s): [facility]  Reason for Treatment: Bipolar Disoprder and SA Does patient have an ACCT team?: No Does patient have Intensive In-House Services?  : No Does patient have [facility] services? : No Does patient have P4CC services?: No  Past Medical History:  Past Medical History:  Diagnosis Date  . Anxiety   . Asthma    as a child  . Bipolar disorder (HCC)    "supposed to take Latuda, but it is too expensive"  . Closed head injury with concussion   . Complication of anesthesia    "I woke up from surgery in April (23, 2019) very mean"  . Depression   .  History of kidney stones     passed x2 lithotripsy    Past Surgical History:  Procedure Laterality Date  . AMPUTATION Right 05/24/2017   Procedure: LITTLE FINGER REPAIR EXTENSOR AND FLEXOR TENDON, LATERAL LIGAMENT REPAIR, NERVE REPAIR, JOINT PINNING;  Surgeon: Dominica SeverinGramig, William, MD;  Location: [facility] OR;  Service: Orthopedics;  Laterality: Right;  .  CHOLECYSTECTOMY    . CYSTOSCOPY W/ STONE MANIPULATION    . FOOT SURGERY Right    fracture  . HARDWARE REMOVAL Right 07/12/2017   Procedure: Right small finger hardware removal, pin removal;  Surgeon: Dominica SeverinGramig, William, MD;  Location: [facility] OR;  Service: Orthopedics;  Laterality: Right;  45 mins  . LITHOTRIPSY     Family History:  Family History  Problem Relation Age of Onset  . Depression Mother   . Alcohol abuse Maternal Uncle   . Depression Maternal Uncle    Family Psychiatric  History: Reports that his maternal uncle committed suicide. Social History:  Social History   Substance and Sexual Activity  Alcohol Use Yes  . Alcohol/week: 42.0 standard drinks  . Types: 42 Cans of beer per week   Comment: 5 days clean, 6 pack a day prior to that time.      Social History   Substance and Sexual Activity  Drug Use Yes  . Frequency: 7.0 times per week  . Types: Marijuana    Social History   Socioeconomic History  . Marital status: Legally Separated    Spouse name: Not on file  . Number of children: Not on file  . Years of education: Not on file  . Highest education level: Not on file  Occupational History  . Not on file  Social Needs  . Financial resource strain: Not on file  . Food insecurity    Worry: Not on file    Inability: Not on file  . Transportation needs    Medical: Not on file    Non-medical: Not on file  Tobacco Use  . Smoking status: Current Every Day Smoker    Packs/day: 1.50    Years: 14.00    Pack years: 21.00    Types: Cigarettes  . Smokeless tobacco: Never Used  . Tobacco comment: Reports has cut back to 1 pk a day, has Chantix but has not started.   Substance and Sexual Activity  . Alcohol use: Yes    Alcohol/week: 42.0 standard drinks    Types: 42 Cans of beer per week    Comment: 5 days clean, 6 pack a day prior to that time.   . Drug use: Yes    Frequency: 7.0 times per week    Types: Marijuana  . Sexual activity: Yes    Partners: Female     Birth control/protection: None  Lifestyle  . Physical activity    Days per week: Not on file    Minutes per session: Not on file  . Stress: Not on file  Relationships  . Social Musicianconnections    Talks on phone: Not on file    Gets together: Not on file    Attends religious service: Not on file    Active member of club or organization: Not on file    Attends meetings of clubs or organizations: Not on file    Relationship status: Not on file  Other Topics Concern  . Not on file  Social History Narrative  . Not on file   Additional Social History: Patient living with his fiance and his 34-year-old daughter  and step kids.    Allergies:  No Known Allergies  Labs:  Results for orders placed or performed during the hospital encounter of 08/13/18 (from the past 48 hour(s))  Comprehensive metabolic panel     Status: Abnormal   Collection Time: 08/13/18  7:02 PM  Result Value Ref Range   Sodium 140 135 - 145 mmol/L   Potassium 3.5 3.5 - 5.1 mmol/L   Chloride 107 98 - 111 mmol/L   CO2 26 22 - 32 mmol/L   Glucose, Bld 94 70 - 99 mg/dL   BUN 13 6 - 20 mg/dL   Creatinine, Ser 0.86 0.61 - 1.24 mg/dL   Calcium 8.4 (L) 8.9 - 10.3 mg/dL   Total Protein 6.4 (L) 6.5 - 8.1 g/dL   Albumin 3.8 3.5 - 5.0 g/dL   AST 29 15 - 41 U/L   ALT 20 0 - 44 U/L   Alkaline Phosphatase 72 38 - 126 U/L   Total Bilirubin 0.8 0.3 - 1.2 mg/dL   GFR calc non Af Amer >60 >60 mL/min   GFR calc Af Amer >60 >60 mL/min   Anion gap 7 5 - 15    Comment: Performed at [facility], [address] [place], [place] [address]  Ethanol     Status: None   Collection Time: 08/13/18  7:02 PM  Result Value Ref Range   Alcohol, Ethyl (B) <10 <10 mg/dL    Comment: (NOTE) Lowest detectable limit for serum alcohol is 10 mg/dL. For medical purposes only. Performed at [facility], [address] [place], [place] [address]   Salicylate level     Status: None   Collection Time: 08/13/18  7:02 PM  Result  Value Ref Range   Salicylate Lvl <2.5 2.8 - 30.0 mg/dL    Comment: Performed at [facility], [address] [place], [place] [address]  Acetaminophen level     Status: Abnormal   Collection Time: 08/13/18  7:02 PM  Result Value Ref Range   Acetaminophen (Tylenol), Serum <10 (L) 10 - 30 ug/mL    Comment: (NOTE) Therapeutic concentrations vary significantly. A range of 10-30 ug/mL  may be an effective concentration for many patients. However, some  are best treated at concentrations outside of this range. Acetaminophen concentrations >150 ug/mL at 4 hours after ingestion  and >50 ug/mL at 12 hours after ingestion are often associated with  toxic reactions. Performed at [facility], [address] [place], [place] [address]   cbc     Status: None   Collection Time: 08/13/18  7:02 PM  Result Value Ref Range   WBC 8.7 4.0 - 10.5 K/uL   RBC 4.74 4.22 - 5.81 MIL/uL   Hemoglobin 14.1 13.0 - 17.0 g/dL   HCT 41.1 39.0 - 52.0 %   MCV 86.7 80.0 - 100.0 fL   MCH 29.7 26.0 - 34.0 pg   MCHC 34.3 30.0 - 36.0 g/dL   RDW 13.1 11.5 - 15.5 %   Platelets 224 150 - 400 K/uL   nRBC 0.0 0.0 - 0.2 %    Comment: Performed at [facility], [address] [place],  [address]  Urine Drug Screen, Qualitative     Status: Abnormal   Collection Time: 08/13/18  7:50 PM  Result Value Ref Range   Tricyclic, Ur Screen NONE DETECTED NONE DETECTED   Amphetamines, Ur Screen NONE DETECTED NONE DETECTED   MDMA (Ecstasy)Ur Screen NONE DETECTED NONE DETECTED  Cocaine Metabolite,Ur Hodges POSITIVE (A) NONE DETECTED   Opiate, Ur Screen NONE DETECTED NONE DETECTED   Phencyclidine (PCP) Ur S NONE DETECTED NONE DETECTED   Cannabinoid 50 Ng, Ur Media POSITIVE (A) NONE DETECTED   Barbiturates, Ur Screen NONE DETECTED NONE DETECTED   Benzodiazepine, Ur Scrn NONE DETECTED NONE DETECTED   Methadone Scn, Ur NONE DETECTED NONE DETECTED    Comment: (NOTE) Tricyclics + metabolites, urine     Cutoff 1000 ng/mL Amphetamines + metabolites, urine  Cutoff 1000 ng/mL MDMA (Ecstasy), urine              Cutoff 500 ng/mL Cocaine Metabolite, urine          Cutoff 300 ng/mL Opiate + metabolites, urine        Cutoff 300 ng/mL Phencyclidine (PCP), urine         Cutoff 25 ng/mL Cannabinoid, urine                 Cutoff 50 ng/mL Barbiturates + metabolites, urine  Cutoff 200 ng/mL Benzodiazepine, urine              Cutoff 200 ng/mL Methadone, urine                   Cutoff 300 ng/mL The urine drug screen provides only a preliminary, unconfirmed analytical test result and should not be used for non-medical purposes. Clinical consideration and professional judgment should be applied to any positive drug screen result due to possible interfering substances. A more specific alternate chemical method must be used in order to obtain a confirmed analytical result. Gas chromatography / mass spectrometry (GC/MS) is the preferred confirmat ory method. Performed at [facility], [address] [place], [place] [address]     Medications:  No current facility-administered medications for this encounter.    Current Outpatient Medications  Medication Sig Dispense Refill  . acetaminophen (TYLENOL) 325 MG tablet Take 650 mg by mouth every 6 (six) hours as needed for mild pain.    Marland Kitchen. albuterol (PROVENTIL HFA;VENTOLIN HFA) 108 (90 Base) MCG/ACT inhaler Inhale 2 puffs into the lungs every 6 (six) hours as needed for wheezing or shortness of breath. (Patient not taking: Reported on 04/19/2018) 1 Inhaler 2  . cyclobenzaprine (FLEXERIL) 10 MG tablet Take 1 tablet (10 mg total) by mouth 3 (three) times daily as needed for muscle spasms. (Patient not taking: Reported on 04/19/2018) 12 tablet 0  . ibuprofen (ADVIL,MOTRIN) 200 MG tablet Take 200 mg by mouth every 6 (six) hours as needed for moderate pain.    . Multiple Vitamin (MULTIVITAMIN) tablet Take 1 tablet by mouth daily.    . naproxen (NAPROSYN)  500 MG tablet Take 1 tablet (500 mg total) by mouth 2 (two) times daily with a meal. 20 tablet 0  . oxyCODONE-acetaminophen (PERCOCET/ROXICET) 5-325 MG tablet Take 1 tablet by mouth every 6 (six) hours as needed. (Patient not taking: Reported on 04/19/2018) 15 tablet 0  . PARoxetine (PAXIL) 30 MG tablet Take 1 tablet (30 mg total) by mouth daily. 90 tablet 0  . QUEtiapine (SEROQUEL) 50 MG tablet Take 1 tablet (50 mg total) by mouth at bedtime. 90 tablet 0    Musculoskeletal: Strength & Muscle Tone: within normal limits Gait & Station: normal Patient leans: N/A  Psychiatric Specialty Exam: Physical Exam  ROS  Blood pressure 140/82, pulse 78, temperature 99 F (37.2 C), temperature source Oral, resp. rate 18, height 6\' 1"  (1.854 m), weight 97.5 kg, SpO2 96 %.  Body mass index is 28.37 kg/m.  General Appearance: Casual  Eye Contact:  Fair  Speech:  Slow  Volume:  Decreased  Mood:  Anxious, Depressed, Dysphoric and Irritable  Affect:  Constricted, Depressed, Full Range and Tearful  Thought Process:  Coherent  Orientation:  Full (Time, Place, and Person)  Thought Content:  Logical  Suicidal Thoughts: Vague suicidal ideations  Homicidal Thoughts:  No  Memory:  Immediate;   Fair Recent;   Fair Remote;   Fair  Judgement:  Impaired  Insight:  Shallow  Psychomotor Activity:  Normal  Concentration:  Concentration: Fair and Attention Span: Fair  Recall:  [employer] of Knowledge:  Fair  Language:  Fair  Akathisia:  No  Handed:  Right  AIMS (if indicated):     Assets:  Communication Skills Desire for Improvement Resilience Social Support Vocational/Educational  ADL's:  Intact  Cognition:  WNL  Sleep:   poor     Treatment Plan Summary: Daily contact with patient to assess and evaluate symptoms and progress in treatment and Medication management  Patient is a 33 year old male with longer standing bipolar disorder, and polysubstance abuse with alcohol, opiates cocaine and weed.   Presenting with suicidal ideations and depression that has worsened recently. Patient will be admitted to the inpatient unit once a bed becomes available. Patient will be started on a CIWA protocol. Restart Seroquel at 50 mg twice daily. Restart Paxil at 20 mg once daily. Discussed plan and  disposition with ER attending.  Disposition: Recommend psychiatric Inpatient admission when medically cleared.  This service was provided via telemedicine using a 2-way, interactive audio and video technology.  Names of all persons participating in this telemedicine service and their role in this encounter. Name: Maryclare Labrador, MD Role: Consulting Psychiatrist  Name: Christiane Ha Bugaj Role: Patient  Patrick North, MD 08/14/2018 9:51 AM

## 2018-08-14 NOTE — ED Notes (Signed)
Pt's fiance calling for update on patient, phone given to patient.

## 2018-08-14 NOTE — ED Notes (Signed)
BEHAVIORAL HEALTH ROUNDING Patient sleeping: Yes.   Patient alert and oriented: eyes closed  Appears asleep Behavior appropriate: Yes.  ; If no, describe:  Nutrition and fluids offered: Yes  Toileting and hygiene offered: sleeping Sitter present: q 15 minute observations and security camera monitoring Law enforcement present: yes  ODS 

## 2018-08-14 NOTE — ED Notes (Signed)
Pt asleep, breakfast tray placed in rm.  

## 2018-08-14 NOTE — ED Notes (Signed)
Pt has been in bed with eyes closed and regular/even/unlabored respirations for this shift. He reports getting some rest and appears mildly distressed. Says he is experiencing a headache and anxiety. Moist palms noted. 1 mg Ativan PO given for CIWA 5. Will continue to monitor for needs/safety.

## 2018-08-14 NOTE — ED Provider Notes (Signed)
-----------------------------------------   6:21 AM on 08/14/2018 -----------------------------------------   Blood pressure 140/82, pulse 78, temperature 99 F (37.2 C), temperature source Oral, resp. rate 18, height 6\' 1"  (1.854 m), weight 97.5 kg, SpO2 96 %.  The patient is calm and cooperative at this time.  There have been no acute events since the last update.  Awaiting disposition plan from Behavioral Medicine and/or Social Work team(s).   Paulette Blanch, MD 08/14/18 [phone]

## 2018-08-14 NOTE — ED Notes (Signed)
He is talking on the phone  NAD observed

## 2018-08-14 NOTE — ED Notes (Signed)
ED BHU [place] Is the patient under IVC or is there intent for IVC:  voluntary Is the patient medically cleared: Yes.   Is there vacancy in the ED BHU: Yes.   Is the population mix appropriate for patient: Yes.   Is the patient awaiting placement in inpatient or outpatient setting: Yes.   Has the patient had a psychiatric consult: Yes.   Survey of unit performed for contraband, proper placement and condition of furniture, tampering with fixtures in bathroom, shower, and each patient room: Yes.  ; Findings:  APPEARANCE/BEHAVIOR Calm and cooperative NEURO ASSESSMENT Orientation: oriented x3  Denies pain Hallucinations: No.None noted (Hallucinations)  denies Speech: Normal Gait: normal RESPIRATORY ASSESSMENT Even  Unlabored respirations  CARDIOVASCULAR ASSESSMENT Pulses equal   regular rate  Skin warm and dry   GASTROINTESTINAL ASSESSMENT no GI complaint EXTREMITIES Full ROM  PLAN OF CARE Provide calm/safe environment. Vital signs assessed twice daily. ED BHU Assessment once each 12-hour shift. Collaborate with TTS daily or as condition indicates. Assure the ED provider has rounded once each shift. Provide and encourage hygiene. Provide redirection as needed. Assess for escalating behavior; address immediately and inform ED provider.  Assess family dynamic and appropriateness for visitation as needed: Yes.  ; If necessary, describe findings:  Educate the patient/family about BHU procedures/visitation: Yes.  ; If necessary, describe findings:

## 2018-08-15 ENCOUNTER — Other Ambulatory Visit: Payer: Self-pay

## 2018-08-15 ENCOUNTER — Inpatient Hospital Stay
Admission: RE | Admit: 2018-08-15 | Discharge: 2018-08-17 | DRG: 885 | Disposition: A | Discharge status: Home / Self Care | Payer: No Typology Code available for payment source | Source: Intra-hospital | Attending: Psychiatry | Admitting: Psychiatry

## 2018-08-15 DIAGNOSIS — F332 Major depressive disorder, recurrent severe without psychotic features: Secondary | ICD-10-CM | POA: Diagnosis present

## 2018-08-15 DIAGNOSIS — Z87442 Personal history of urinary calculi: Secondary | ICD-10-CM

## 2018-08-15 DIAGNOSIS — F1210 Cannabis abuse, uncomplicated: Secondary | ICD-10-CM

## 2018-08-15 DIAGNOSIS — Z811 Family history of alcohol abuse and dependence: Secondary | ICD-10-CM

## 2018-08-15 DIAGNOSIS — Z91120 Patient's intentional underdosing of medication regimen due to financial hardship: Secondary | ICD-10-CM

## 2018-08-15 DIAGNOSIS — F419 Anxiety disorder, unspecified: Secondary | ICD-10-CM | POA: Diagnosis present

## 2018-08-15 DIAGNOSIS — F319 Bipolar disorder, unspecified: Secondary | ICD-10-CM | POA: Diagnosis present

## 2018-08-15 DIAGNOSIS — F1120 Opioid dependence, uncomplicated: Secondary | ICD-10-CM | POA: Diagnosis present

## 2018-08-15 DIAGNOSIS — Z9049 Acquired absence of other specified parts of digestive tract: Secondary | ICD-10-CM

## 2018-08-15 DIAGNOSIS — Z9114 Patient's other noncompliance with medication regimen: Secondary | ICD-10-CM

## 2018-08-15 DIAGNOSIS — Y92009 Unspecified place in unspecified non-institutional (private) residence as the place of occurrence of the external cause: Secondary | ICD-10-CM

## 2018-08-15 DIAGNOSIS — F1410 Cocaine abuse, uncomplicated: Secondary | ICD-10-CM

## 2018-08-15 DIAGNOSIS — R45851 Suicidal ideations: Secondary | ICD-10-CM | POA: Diagnosis present

## 2018-08-15 DIAGNOSIS — T43506A Underdosing of unspecified antipsychotics and neuroleptics, initial encounter: Secondary | ICD-10-CM | POA: Diagnosis present

## 2018-08-15 DIAGNOSIS — F3130 Bipolar disorder, current episode depressed, mild or moderate severity, unspecified: Secondary | ICD-10-CM | POA: Diagnosis present

## 2018-08-15 DIAGNOSIS — Z818 Family history of other mental and behavioral disorders: Secondary | ICD-10-CM | POA: Diagnosis not present

## 2018-08-15 DIAGNOSIS — F17210 Nicotine dependence, cigarettes, uncomplicated: Secondary | ICD-10-CM | POA: Diagnosis present

## 2018-08-15 DIAGNOSIS — F1020 Alcohol dependence, uncomplicated: Secondary | ICD-10-CM | POA: Diagnosis present

## 2018-08-15 MED ORDER — THIAMINE HCL 100 MG/ML IJ SOLN: 100.0000 mg | Freq: Every day | INTRAMUSCULAR | Status: DC

## 2018-08-15 MED ORDER — NICOTINE 21 MG/24HR TD PT24
21.0000 mg | MEDICATED_PATCH | Freq: Every day | TRANSDERMAL | Status: DC
  Administered 2018-08-15 – 2018-08-17 (×3): 21 mg via TRANSDERMAL
  Filled 2018-08-15 (×3): qty 1

## 2018-08-15 MED ORDER — ADULT MULTIVITAMIN W/MINERALS CH
1.0000 | ORAL_TABLET | Freq: Every day | ORAL | Status: DC
  Administered 2018-08-15 – 2018-08-17 (×3): 1 via ORAL
  Filled 2018-08-15 (×3): qty 1

## 2018-08-15 MED ORDER — ACETAMINOPHEN 325 MG PO TABS
650.0000 mg | ORAL_TABLET | Freq: Four times a day (QID) | ORAL | Status: DC | PRN
  Administered 2018-08-16: 650 mg via ORAL
  Filled 2018-08-15: qty 2

## 2018-08-15 MED ORDER — ALUM & MAG HYDROXIDE-SIMETH 200-200-20 MG/5ML PO SUSP: 30.0000 mL | ORAL | Status: DC | PRN

## 2018-08-15 MED ORDER — FOLIC ACID 1 MG PO TABS
1.0000 mg | ORAL_TABLET | Freq: Every day | ORAL | Status: DC
  Administered 2018-08-15 – 2018-08-17 (×3): 1 mg via ORAL
  Filled 2018-08-15 (×3): qty 1

## 2018-08-15 MED ORDER — MAGNESIUM HYDROXIDE 400 MG/5ML PO SUSP: 30.0000 mL | Freq: Every day | ORAL | Status: DC | PRN

## 2018-08-15 MED ORDER — PAROXETINE HCL 20 MG PO TABS
40.0000 mg | ORAL_TABLET | Freq: Every day | ORAL | Status: DC
  Administered 2018-08-15 – 2018-08-17 (×3): 40 mg via ORAL
  Filled 2018-08-15 (×3): qty 2

## 2018-08-15 MED ORDER — LORAZEPAM 1 MG PO TABS
1.0000 mg | ORAL_TABLET | Freq: Four times a day (QID) | ORAL | Status: DC | PRN
  Administered 2018-08-15 – 2018-08-16 (×2): 1 mg via ORAL
  Filled 2018-08-15 (×2): qty 1

## 2018-08-15 MED ORDER — VITAMIN B-1 100 MG PO TABS
100.0000 mg | ORAL_TABLET | Freq: Every day | ORAL | Status: DC
  Administered 2018-08-15 – 2018-08-17 (×3): 100 mg via ORAL
  Filled 2018-08-15 (×3): qty 1

## 2018-08-15 MED ORDER — LORAZEPAM 2 MG/ML IJ SOLN
1.0000 mg | Freq: Four times a day (QID) | INTRAMUSCULAR | Status: DC | PRN
  Administered 2018-08-16 – 2018-08-17 (×2): 1 mg via INTRAVENOUS
  Filled 2018-08-15 (×2): qty 1

## 2018-08-15 NOTE — ED Notes (Signed)

## 2018-08-15 NOTE — ED Notes (Signed)
ED BHU [place] Is the patient under IVC or is there intent for IVC:  voluntary Is the patient medically cleared: Yes.   Is there vacancy in the ED BHU: Yes.   Is the population mix appropriate for patient: Yes.   Is the patient awaiting placement in inpatient or outpatient setting: Yes. Bed assigned on [facility] BMU awaiting transfer   Has the patient had a psychiatric consult: Yes.   Survey of unit performed for contraband, proper placement and condition of furniture, tampering with fixtures in bathroom, shower, and each patient room: Yes.  ; Findings:  APPEARANCE/BEHAVIOR Calm and cooperative NEURO ASSESSMENT Orientation: oriented x3  Denies pain Hallucinations: No.None noted (Hallucinations)  Denies  Speech: Normal Gait: normal RESPIRATORY ASSESSMENT Even  Unlabored respirations  CARDIOVASCULAR ASSESSMENT Pulses equal   regular rate  Skin warm and dry   GASTROINTESTINAL ASSESSMENT no GI complaint EXTREMITIES Full ROM  PLAN OF CARE Provide calm/safe environment. Vital signs assessed twice daily. ED BHU Assessment once each 12-hour shift. Assure the ED provider has rounded once each shift. Provide and encourage hygiene. Provide redirection as needed. Assess for escalating behavior; address immediately and inform ED provider.  Assess family dynamic and appropriateness for visitation as needed: Yes.  ; If necessary, describe findings:  Educate the patient/family about BHU procedures/visitation: Yes.  ; If necessary, describe findings:

## 2018-08-15 NOTE — BHH Group Notes (Signed)
Feelings Around Diagnosis 08/15/2018 1PM  Type of Therapy/Topic:  Group Therapy:  Feelings about Diagnosis  Participation Level:  Did Not Attend   Description of Group:   This group will allow patients to explore their thoughts and feelings about diagnoses they have received. Patients will be guided to explore their level of understanding and acceptance of these diagnoses. Facilitator will encourage patients to process their thoughts and feelings about the reactions of others to their diagnosis and will guide patients in identifying ways to discuss their diagnosis with significant others in their lives. This group will be process-oriented, with patients participating in exploration of their own experiences, giving and receiving support, and processing challenge from other group members.   Therapeutic Goals: 1. Patient will demonstrate understanding of diagnosis as evidenced by identifying two or more symptoms of the disorder 2. Patient will be able to express two feelings regarding the diagnosis 3. Patient will demonstrate their ability to communicate their needs through discussion and/or role play  Summary of Patient Progress:       Therapeutic Modalities:   Cognitive Behavioral Therapy Brief Therapy Feelings Identification    Eudora Guevarra T Panagiota Perfetti, LCSW 08/15/2018 2:19 PM  

## 2018-08-15 NOTE — BH Assessment (Signed)
Patient is to be admitted to [facility] by Dr. Einar Grad.  Attending Physician will be Dr. Weber Cooks.   Patient has been assigned to room 320, by [facility] Charge Nurse Demetria.   Intake Paper Work has been signed and placed on patient chart.   ER staff is aware of the admission:  Lattie Haw, ER Secretary    Dr. Jari Pigg, ER MD   Amy T., Patient's Nurse   Elberta Fortis, Patient Access.

## 2018-08-15 NOTE — Plan of Care (Signed)
New admission.  Problem: Education: Goal: Knowledge of [facility] Education information/materials will improve Outcome: Not Progressing Goal: Emotional status will improve Outcome: Not Progressing Goal: Mental status will improve Outcome: Not Progressing Goal: Verbalization of understanding the information provided will improve Outcome: Not Progressing   Problem: Safety: Goal: Periods of time without injury will increase Outcome: Not Progressing   Problem: Health Behavior/Discharge Planning: Goal: Compliance with therapeutic regimen will improve Outcome: Not Progressing   Problem: Health Behavior/Discharge Planning: Goal: Ability to identify changes in lifestyle to reduce recurrence of condition will improve Outcome: Not Progressing   Problem: Physical Regulation: Goal: Complications related to the disease process, condition or treatment will be avoided or minimized Outcome: Not Progressing   Problem: Health Behavior/Discharge Planning: Goal: Identification of resources available to assist in meeting health care needs will improve Outcome: Not Progressing

## 2018-08-15 NOTE — Tx Team (Signed)
Initial Treatment Plan 08/15/2018 4:04 PM DAKHARI ZUVER LXB:[id number]    PATIENT STRESSORS: Marital or family conflict Substance abuse   PATIENT STRENGTHS: Ability for insight General fund of knowledge Motivation for treatment/growth Supportive family/friends   PATIENT IDENTIFIED PROBLEMS: Substance abuse   Depression                   DISCHARGE CRITERIA:  Ability to meet basic life and health needs Improved stabilization in mood, thinking, and/or behavior Reduction of life-threatening or endangering symptoms to within safe limits  PRELIMINARY DISCHARGE PLAN: Outpatient therapy Return to previous living arrangement Return to previous work or school arrangements  PATIENT/FAMILY INVOLVEMENT: This treatment plan has been presented to and reviewed with the patient, [name]. The patient has been given the opportunity to ask questions and make suggestions.  [name], RN 08/15/2018, 4:04 PM

## 2018-08-15 NOTE — H&P (Signed)
Psychiatric Admission Assessment Adult  Patient Identification: [name] MRN:  [id number] Date of Evaluation:  08/15/2018 Chief Complaint:  depression Principal Diagnosis: Severe recurrent major depression without psychotic features (HCC) Diagnosis:  Principal Problem:   Severe recurrent major depression without psychotic features (HCC) Active Problems:   Opioid use disorder, severe, dependence (HCC)   Alcohol use disorder, severe, dependence (HCC)   Cocaine abuse (HCC)   Cannabis abuse  History of Present Illness: Patient seen chart reviewed.  Patient with a history of depression and substance abuse presented voluntarily to the emergency room seeking treatment.  Patient tells me that he has been very depressed recently.  Feels down negative sad tearful all the time.  Major stress that he talks about is that he is having trouble with his ex-wife.  She reportedly will not let him see his daughter.  Patient also is drinking heavily.  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  Also eventually admits that he is using Suboxone strips daily that he gets off the street also using cocaine also using marijuana daily.  Patient denies suicidal thoughts denies homicidal thoughts denies psychosis.  He says that he is still compliant with his outpatient Paxil that he gets from his psychiatrist in [place]. Associated Signs/Symptoms: Depression Symptoms:  depressed mood, anhedonia, difficulty concentrating, hopelessness, anxiety, (Hypo) Manic Symptoms:  Distractibility, Anxiety Symptoms:  Excessive Worry, Psychotic Symptoms:  None reported PTSD Symptoms: Negative Total Time spent with patient: 1 hour  Past Psychiatric History: Patient has a past history of depression and substance abuse.  Has 2 prior hospitalizations both associated with suicide attempts.  Reports that recently he has  had the thought of killing himself by overdosing but eventually chose not to do it.  He currently denies suicidal ideation.  The diagnosis of bipolar disorder had been considered in the past but it does not seem to be really clear evidence for that.  He is taking Paxil prescribed by his outpatient psychiatrist in [place] as well as a low dose of Seroquel.  Patient has a history of opiate abuse which he eventually felt like he got completely under control although now he is back to using Suboxone daily.  He does not have any history of DTs or seizures  Is the patient at risk to self? Yes.    Has the patient been a risk to self in the past 6 months? Yes.    Has the patient been a risk to self within the distant past? Yes.    Is the patient a risk to others? Yes.    Has the patient been a risk to others in the past 6 months? No.  Has the patient been a risk to others within the distant past? No.   Prior Inpatient Therapy:   Prior Outpatient Therapy:    Alcohol Screening: 1. How often do you have a drink containing alcohol?: 4 or more times a week 2. How many drinks containing alcohol do you have on a typical day when you are drinking?: 5 or 6 3. How often do you have six or more drinks on one occasion?: Less than monthly AUDIT-C Score: 7 4. How often during the last year have you found that you were not able to stop drinking once you had started?: Monthly 5. How often during the last year have you failed to do what was normally  expected from you becasue of drinking?: Less than monthly 6. How often during the last year have you needed a first drink in the morning to get yourself going after a heavy drinking session?: Daily or almost daily 7. How often during the last year have you had a feeling of guilt of remorse after drinking?: Weekly 8. How often during the last year have you been unable to remember what happened the night before because you had been drinking?: Monthly 9. Have you or someone  else been injured as a result of your drinking?: No 10. Has a relative or friend or a doctor or another health worker been concerned about your drinking or suggested you cut down?: No Alcohol Use Disorder Identification Test Final Score (AUDIT): 19 Alcohol Brief Interventions/Follow-up: Continued Monitoring Substance Abuse History in the last 12 months:  Yes.   Consequences of Substance Abuse: Medical Consequences:  Tremulousness sickness now abuse of Suboxone Family Consequences:  Estranged from family.  Also major potential legal problems from drinking and driving Previous Psychotropic Medications: Yes  Psychological Evaluations: Yes  Past Medical History:  Past Medical History:  Diagnosis Date  . Anxiety   . Asthma    as a child  . Bipolar disorder (HCC)    "supposed to take Latuda, but it is too expensive"  . Closed head injury with concussion   . Complication of anesthesia    "I woke up from surgery in April (23, 2019) very mean"  . Depression   . History of kidney stones     passed x2 lithotripsy    Past Surgical History:  Procedure Laterality Date  . AMPUTATION Right 05/24/2017   Procedure: LITTLE FINGER REPAIR EXTENSOR AND FLEXOR TENDON, LATERAL LIGAMENT REPAIR, NERVE REPAIR, JOINT PINNING;  Surgeon: Dominica SeverinGramig, William, MD;  Location: [facility] OR;  Service: Orthopedics;  Laterality: Right;  . CHOLECYSTECTOMY    . CYSTOSCOPY W/ STONE MANIPULATION    . FOOT SURGERY Right    fracture  . HARDWARE REMOVAL Right 07/12/2017   Procedure: Right small finger hardware removal, pin removal;  Surgeon: Dominica SeverinGramig, William, MD;  Location: [facility] OR;  Service: Orthopedics;  Laterality: Right;  45 mins  . LITHOTRIPSY     Family History:  Family History  Problem Relation Age of Onset  . Depression Mother   . Alcohol abuse Maternal Uncle   . Depression Maternal Uncle    Family Psychiatric  History: He had an uncle who killed himself also has alcohol abuse in the family Tobacco Screening: Have you used  any form of tobacco in the last 30 days? (Cigarettes, Smokeless Tobacco, Cigars, and/or Pipes): Yes Tobacco use, Select all that apply: 5 or more cigarettes per day Are you interested in Tobacco Cessation Medications?: Yes, will notify MD for an order Counseled patient on smoking cessation including recognizing danger situations, developing coping skills and basic information about quitting provided: Refused/Declined practical counseling Social History:  Social History   Substance and Sexual Activity  Alcohol Use Yes  . Alcohol/week: 42.0 standard drinks  . Types: 42 Cans of beer per week   Comment: 5 days clean, 6 pack a day prior to that time.      Social History   Substance and Sexual Activity  Drug Use Yes  . Frequency: 7.0 times per week  . Types: Marijuana    Additional Social History:      Pain Medications: see PTA Prescriptions: see PTA Over the Counter: see PTA History of alcohol / drug use?: Yes  Allergies:  No Known Allergies Lab Results:  Results for orders placed or performed during the hospital encounter of 08/13/18 (from the past 48 hour(s))  Comprehensive metabolic panel     Status: Abnormal   Collection Time: 08/13/18  7:02 PM  Result Value Ref Range   Sodium 140 135 - 145 mmol/L   Potassium 3.5 3.5 - 5.1 mmol/L   Chloride 107 98 - 111 mmol/L   CO2 26 22 - 32 mmol/L   Glucose, Bld 94 70 - 99 mg/dL   BUN 13 6 - 20 mg/dL   Creatinine, Ser 9.600.86 0.61 - 1.24 mg/dL   Calcium 8.4 (L) 8.9 - 10.3 mg/dL   Total Protein 6.4 (L) 6.5 - 8.1 g/dL   Albumin 3.8 3.5 - 5.0 g/dL   AST 29 15 - 41 U/L   ALT 20 0 - 44 U/L   Alkaline Phosphatase 72 38 - 126 U/L   Total Bilirubin 0.8 0.3 - 1.2 mg/dL   GFR calc non Af Amer >60 >60 mL/min   GFR calc Af Amer >60 >60 mL/min   Anion gap 7 5 - 15    Comment: Performed at [facility], [address] [place], [place] [address]  Ethanol     Status: None   Collection Time: 08/13/18  7:02 PM   Result Value Ref Range   Alcohol, Ethyl (B) <10 <10 mg/dL    Comment: (NOTE) Lowest detectable limit for serum alcohol is 10 mg/dL. For medical purposes only. Performed at [facility], [address] [place], [place] [address]   Salicylate level     Status: None   Collection Time: 08/13/18  7:02 PM  Result Value Ref Range   Salicylate Lvl <7.0 2.8 - 30.0 mg/dL    Comment: Performed at [facility], [address] [place], [place] [address]  Acetaminophen level     Status: Abnormal   Collection Time: 08/13/18  7:02 PM  Result Value Ref Range   Acetaminophen (Tylenol), Serum <10 (L) 10 - 30 ug/mL    Comment: (NOTE) Therapeutic concentrations vary significantly. A range of 10-30 ug/mL  may be an effective concentration for many patients. However, some  are best treated at concentrations outside of this range. Acetaminophen concentrations >150 ug/mL at 4 hours after ingestion  and >50 ug/mL at 12 hours after ingestion are often associated with  toxic reactions. Performed at [facility], [address] [place], [place] [address]   cbc     Status: None   Collection Time: 08/13/18  7:02 PM  Result Value Ref Range   WBC 8.7 4.0 - 10.5 K/uL   RBC 4.74 4.22 - 5.81 MIL/uL   Hemoglobin 14.1 13.0 - 17.0 g/dL   HCT 08.641.1 57.839.0 - 46.952.0 %   MCV 86.7 80.0 - 100.0 fL   MCH 29.7 26.0 - 34.0 pg   MCHC 34.3 30.0 - 36.0 g/dL   RDW 62.913.1 52.811.5 - 41.315.5 %   Platelets 224 150 - 400 K/uL   nRBC 0.0 0.0 - 0.2 %    Comment: Performed at [facility], [address] [place], [place] [address]  Urine Drug Screen, Qualitative     Status: Abnormal   Collection Time: 08/13/18  7:50 PM  Result Value Ref Range   Tricyclic, Ur Screen NONE DETECTED NONE DETECTED   Amphetamines, Ur Screen NONE DETECTED NONE DETECTED   MDMA (Ecstasy)Ur Screen NONE DETECTED NONE DETECTED   Cocaine Metabolite,Ur [place] POSITIVE (A) NONE  DETECTED   Opiate, Ur Screen NONE DETECTED NONE  DETECTED   Phencyclidine (PCP) Ur S NONE DETECTED NONE DETECTED   Cannabinoid 50 Ng, Ur [place] POSITIVE (A) NONE DETECTED   Barbiturates, Ur Screen NONE DETECTED NONE DETECTED   Benzodiazepine, Ur Scrn NONE DETECTED NONE DETECTED   Methadone Scn, Ur NONE DETECTED NONE DETECTED    Comment: (NOTE) Tricyclics + metabolites, urine    Cutoff 1000 ng/mL Amphetamines + metabolites, urine  Cutoff 1000 ng/mL MDMA (Ecstasy), urine              Cutoff 500 ng/mL Cocaine Metabolite, urine          Cutoff 300 ng/mL Opiate + metabolites, urine        Cutoff 300 ng/mL Phencyclidine (PCP), urine         Cutoff 25 ng/mL Cannabinoid, urine                 Cutoff 50 ng/mL Barbiturates + metabolites, urine  Cutoff 200 ng/mL Benzodiazepine, urine              Cutoff 200 ng/mL Methadone, urine                   Cutoff 300 ng/mL The urine drug screen provides only a preliminary, unconfirmed analytical test result and should not be used for non-medical purposes. Clinical consideration and professional judgment should be applied to any positive drug screen result due to possible interfering substances. A more specific alternate chemical method must be used in order to obtain a confirmed analytical result. Gas chromatography / mass spectrometry (GC/MS) is the preferred confirmat ory method. Performed at [facility], [address] [place], [place] [address]   SARS Coronavirus 2 (CEPHEID - Performed in [facility] hospital lab), Hosp Order     Status: None   Collection Time: 08/14/18 12:09 PM   Specimen: Nasopharyngeal Swab  Result Value Ref Range   SARS Coronavirus 2 NEGATIVE NEGATIVE    Comment: (NOTE) If result is NEGATIVE SARS-CoV-2 target nucleic acids are NOT DETECTED. The SARS-CoV-2 RNA is generally detectable in upper and lower  respiratory specimens during the acute phase of infection. The lowest  concentration of SARS-CoV-2 viral copies this assay can detect is 250  copies / mL. A  negative result does not preclude SARS-CoV-2 infection  and should not be used as the sole basis for treatment or other  patient management decisions.  A negative result may occur with  improper specimen collection / handling, submission of specimen other  than nasopharyngeal swab, presence of viral mutation(s) within the  areas targeted by this assay, and inadequate number of viral copies  (<250 copies / mL). A negative result must be combined with clinical  observations, patient history, and epidemiological information. If result is POSITIVE SARS-CoV-2 target nucleic acids are DETECTED. The SARS-CoV-2 RNA is generally detectable in upper and lower  respiratory specimens dur ing the acute phase of infection.  Positive  results are indicative of active infection with SARS-CoV-2.  Clinical  correlation with patient history and other diagnostic information is  necessary to determine patient infection status.  Positive results do  not rule out bacterial infection or co-infection with other viruses. If result is PRESUMPTIVE POSTIVE SARS-CoV-2 nucleic acids MAY BE PRESENT.   A presumptive positive result was obtained on the submitted specimen  and confirmed on repeat testing.  While 2019 novel coronavirus  (SARS-CoV-2) nucleic acids may be present in the submitted sample  additional  confirmatory testing may be necessary for epidemiological  and / or clinical management purposes  to differentiate between  SARS-CoV-2 and other Sarbecovirus currently known to infect humans.  If clinically indicated additional testing with an alternate test  methodology [id number]) is advised. The SARS-CoV-2 RNA is generally  detectable in upper and lower respiratory sp ecimens during the acute  phase of infection. The expected result is Negative. Fact Sheet for Patients:  BoilerBrush.com.cy Fact Sheet for Healthcare Providers: https://pope.com/ This test is not  yet approved or cleared by the [place] FDA and has been authorized for detection and/or diagnosis of SARS-CoV-2 by FDA under an Emergency Use Authorization (EUA).  This EUA will remain in effect (meaning this test can be used) for the duration of the COVID-19 declaration under Section 564(b)(1) of the Act, 21 U.S.C. section 360bbb-3(b)(1), unless the authorization is terminated or revoked sooner. Performed at [facility], [address] [place], [place] [address]     Blood Alcohol level:  Lab Results  Component Value Date   [facility] <10 08/13/2018   ETH <5 03/25/2015    Metabolic Disorder Labs:  No results found for: HGBA1C, MPG No results found for: PROLACTIN No results found for: CHOL, TRIG, HDL, CHOLHDL, VLDL, LDLCALC  Current Medications: Current Facility-Administered Medications  Medication Dose Route Frequency Provider Last Rate Last Dose  . acetaminophen (TYLENOL) tablet 650 mg  650 mg Oral Q6H PRN Ravi, Himabindu, MD      . alum & mag hydroxide-simeth (MAALOX/MYLANTA) 200-200-20 MG/5ML suspension 30 mL  30 mL Oral Q4H PRN Ravi, Himabindu, MD      . folic acid (FOLVITE) tablet 1 mg  1 mg Oral Daily Abishai Viegas T, MD      . LORazepam (ATIVAN) tablet 1 mg  1 mg Oral Q6H PRN Cohen Doleman, Jackquline Denmark, MD       Or  . LORazepam (ATIVAN) injection 1 mg  1 mg Intravenous Q6H PRN Daniel Johndrow T, MD      . magnesium hydroxide (MILK OF MAGNESIA) suspension 30 mL  30 mL Oral Daily PRN Ravi, Himabindu, MD      . multivitamin with minerals tablet 1 tablet  1 tablet Oral Daily Caridad Silveira T, MD      . nicotine (NICODERM CQ - dosed in mg/24 hours) patch 21 mg  21 mg Transdermal Daily Timmya Blazier T, MD      . PARoxetine (PAXIL) tablet 40 mg  40 mg Oral Daily Tyquisha Sharps T, MD      . thiamine (VITAMIN B-1) tablet 100 mg  100 mg Oral Daily Annelise Mccoy T, MD       Or  . thiamine (B-1) injection 100 mg  100 mg Intravenous Daily Dashia Caldeira, Jackquline Denmark, MD       PTA  Medications: Medications Prior to Admission  Medication Sig Dispense Refill Last Dose  . acetaminophen (TYLENOL) 325 MG tablet Take 650 mg by mouth every 6 (six) hours as needed for mild pain.     Marland Kitchen albuterol (PROVENTIL HFA;VENTOLIN HFA) 108 (90 Base) MCG/ACT inhaler Inhale 2 puffs into the lungs every 6 (six) hours as needed for wheezing or shortness of breath. (Patient not taking: Reported on 04/19/2018) 1 Inhaler 2   . cyclobenzaprine (FLEXERIL) 10 MG tablet Take 1 tablet (10 mg total) by mouth 3 (three) times daily as needed for muscle spasms. (Patient not taking: Reported on 04/19/2018) 12 tablet 0   . ibuprofen (ADVIL,MOTRIN) 200 MG tablet Take 200 mg by  mouth every 6 (six) hours as needed for moderate pain.     . Multiple Vitamin (MULTIVITAMIN) tablet Take 1 tablet by mouth daily.     . naproxen (NAPROSYN) 500 MG tablet Take 1 tablet (500 mg total) by mouth 2 (two) times daily with a meal. (Patient not taking: Reported on 08/15/2018) 20 tablet 0   . oxyCODONE-acetaminophen (PERCOCET/ROXICET) 5-325 MG tablet Take 1 tablet by mouth every 6 (six) hours as needed. (Patient not taking: Reported on 04/19/2018) 15 tablet 0   . PARoxetine (PAXIL) 30 MG tablet Take 1 tablet (30 mg total) by mouth daily. 90 tablet 0   . QUEtiapine (SEROQUEL) 50 MG tablet Take 1 tablet (50 mg total) by mouth at bedtime. 90 tablet 0     Musculoskeletal: Strength & Muscle Tone: within normal limits Gait & Station: normal Patient leans: N/A  Psychiatric Specialty Exam: Physical Exam  Nursing note and vitals reviewed. Constitutional: He appears well-developed and well-nourished.  HENT:  Head: Normocephalic and atraumatic.  Eyes: Pupils are equal, round, and reactive to light. Conjunctivae are normal.  Neck: Normal range of motion.  Cardiovascular: Regular rhythm and normal heart sounds.  Respiratory: Effort normal. No respiratory distress.  GI: Soft.  Musculoskeletal: Normal range of motion.  Neurological: He is  alert.  Skin: Skin is warm and dry.  Psychiatric: Thought content normal. His speech is delayed. He is agitated. He is not aggressive. Thought content is not paranoid. Cognition and memory are impaired. He expresses impulsivity. He exhibits a depressed mood. He expresses no homicidal and no suicidal ideation.    Review of Systems  Constitutional: Negative.   HENT: Negative.   Eyes: Negative.   Respiratory: Negative.   Cardiovascular: Negative.   Gastrointestinal: Negative.   Musculoskeletal: Negative.   Skin: Negative.   Neurological: Negative.   Psychiatric/Behavioral: Positive for depression and substance abuse. Negative for hallucinations and suicidal ideas.    Blood pressure 123/84, pulse 74, temperature 98 F (36.7 C), temperature source Oral, resp. rate 18, height 6\' 1"  (1.854 m), weight 97.5 kg, SpO2 98 %.Body mass index is 28.37 kg/m.  General Appearance: Casual  Eye Contact:  Fair  Speech:  Slow  Volume:  Decreased  Mood:  Depressed  Affect:  Congruent  Thought Process:  Coherent  Orientation:  Full (Time, Place, and Person)  Thought Content:  Logical and Rumination  Suicidal Thoughts:  No  Homicidal Thoughts:  No  Memory:  Immediate;   Fair Recent;   Poor Remote;   Fair  Judgement:  Fair  Insight:  Fair  Psychomotor Activity:  Normal  Concentration:  Concentration: Fair  Recall:  [employer]Fair  Fund of Knowledge:  Fair  Language:  Fair  Akathisia:  No  Handed:  Right  AIMS (if indicated):     Assets:  Desire for Improvement Housing Physical Health Resilience Social Support  ADL's:  Intact  Cognition:  WNL  Sleep:       Treatment Plan Summary: Daily contact with patient to assess and evaluate symptoms and progress in treatment, Medication management and Plan Orders placed for alcohol detox as necessary.  Restart Paxil 40 mg a day.  Engage patient in groups and activities on the unit.  Labs all reviewed.  Medicine will be adjusted as needed.  We can discuss the  possibility for inpatient rehab treatment.  Assess suicidality prior to discharge.  Observation Level/Precautions:  15 minute checks  Laboratory:  UDS  Psychotherapy:    Medications:    Consultations:  Discharge Concerns:    Estimated LOS:  Other:     Physician Treatment Plan for Primary Diagnosis: Severe recurrent major depression without psychotic features (HCC) Long Term Goal(s): Improvement in symptoms so as ready for discharge  Short Term Goals: Ability to disclose and discuss suicidal ideas and Ability to demonstrate self-control will improve  Physician Treatment Plan for Secondary Diagnosis: Principal Problem:   Severe recurrent major depression without psychotic features (HCC) Active Problems:   Opioid use disorder, severe, dependence (HCC)   Alcohol use disorder, severe, dependence (HCC)   Cocaine abuse (HCC)   Cannabis abuse  Long Term Goal(s): Improvement in symptoms so as ready for discharge  Short Term Goals: Compliance with prescribed medications will improve and Ability to identify triggers associated with substance abuse/mental health issues will improve  I certify that inpatient services furnished can reasonably be expected to improve the patient's condition.    Mordecai Rasmussen, MD 7/14/20205:06 PM

## 2018-08-15 NOTE — Progress Notes (Signed)
Patient ID: RAMON ZANDERS, male   DOB: Jul 30, 1985, 33 y.o.   MRN: [id number]   Admission Note:   Report was received from Amy, RN on a 33 year old male who presents Voluntary in no acute distress for the treatment of Substance abuse and Depression. Patient appears flat and depressed. Patient was calm and cooperative with admission process. Patient endorsed depression and anxiety, reporting to this writer "I'm just lost". Patient denies SI/HI/AVH, stating "not right this second". Patient has a past medical history of Asthma, Depression, Anxiety, and Bipolar disorder. Patient's goal while here is to "get me regulated on medicine and help me not be so angry". Skin was assessed with Jinny Blossom, RN and found to be clear of any abnormal marks apart from a bruise on the top left of his upper chest and tattoos on his bilateral upper arms. Patient searched and no contraband found and unit policies explained and understanding verbalized. Consents obtained. Food and fluids offered, and fluids accepted. Patient had no additional questions or concerns.

## 2018-08-15 NOTE — ED Notes (Signed)
Patient observed lying in bed with eyes closed  Even, unlabored respirations observed   NAD pt appears to be sleeping  I will continue to monitor along with every 15 minute visual observations and ongoing security camera monitoring    

## 2018-08-15 NOTE — ED Notes (Signed)
BEHAVIORAL HEALTH ROUNDING Patient sleeping: No. Patient alert and oriented: yes Behavior appropriate: Yes.  ; If no, describe:  Nutrition and fluids offered: yes Toileting and hygiene offered: Yes  Sitter present: q15 minute observations and security camera monitoring Law enforcement present: Yes  ODS  

## 2018-08-15 NOTE — ED Provider Notes (Signed)
-----------------------------------------   6:04 AM on 08/15/2018 -----------------------------------------   Blood pressure 136/73, pulse (!) 55, temperature 98.1 F (36.7 C), temperature source Oral, resp. rate 18, height 6\' 1"  (1.854 m), weight 97.5 kg, SpO2 99 %.  The patient is calm and cooperative at this time.  There have been no acute events since the last update.  Awaiting disposition plan from Behavioral Medicine and/or Social Work team(s).   Paulette Blanch, MD 08/15/18 [id number]

## 2018-08-15 NOTE — BHH Suicide Risk Assessment (Signed)
[facility]   Nursing information obtained from:  Patient Demographic factors:  Male, Adolescent or young adult, Caucasian Current Mental Status:  NA Loss Factors:  NA Historical Factors:  NA Risk Reduction Factors:  Responsible for children under 34 years of age, Sense of responsibility to family, Living with another person, especially a relative  Total Time spent with patient: 1 hour Principal Problem: Severe recurrent major depression without psychotic features (HCC) Diagnosis:  Principal Problem:   Severe recurrent major depression without psychotic features (HCC) Active Problems:   Opioid use disorder, severe, dependence (HCC)   Alcohol use disorder, severe, dependence (HCC)   Cocaine abuse (HCC)   Cannabis abuse  Subjective Data: Patient seen and chart reviewed.  This is a patient with a past history of depression and substance abuse who presented voluntarily to the emergency room reporting severely depressed mood and out-of-control alcohol abuse.  Patient is tearful depressed and feels somewhat hopeless but denies suicidal ideation or intent or plan.  Denies psychotic symptoms.  Admits that he has been using cannabis regularly and using cocaine regularly as well and in addition he has been using Suboxone strips that he buys on the street.  Major life stresses include the alcohol itself and conflict with his ex-wife.  Continued Clinical Symptoms:  Alcohol Use Disorder Identification Test Final Score (AUDIT): 19 The "Alcohol Use Disorders Identification Test", Guidelines for Use in Primary Care, Second Edition.  World Science writerHealth Organization [facility](WHO). Score between 0-7:  no or low risk or alcohol related problems. Score between 8-15:  moderate risk of alcohol related problems. Score between 16-19:  high risk of alcohol related problems. Score 20 or above:  warrants further diagnostic evaluation for alcohol dependence and treatment.   CLINICAL FACTORS:    Depression:   Hopelessness Alcohol/Substance Abuse/Dependencies   Musculoskeletal: Strength & Muscle Tone: within normal limits Gait & Station: normal Patient leans: N/A  Psychiatric Specialty Exam: Physical Exam  Nursing note and vitals reviewed. Constitutional: He appears well-developed and well-nourished.  HENT:  Head: Normocephalic and atraumatic.  Eyes: Pupils are equal, round, and reactive to light. Conjunctivae are normal.  Neck: Normal range of motion.  Cardiovascular: Regular rhythm and normal heart sounds.  Respiratory: Effort normal. No respiratory distress.  GI: Soft.  Musculoskeletal: Normal range of motion.  Neurological: He is alert.  Skin: Skin is warm and dry.  Psychiatric: His speech is delayed. He is slowed and withdrawn. Thought content is not paranoid. Cognition and memory are normal. He expresses impulsivity. He exhibits a depressed mood. He expresses no homicidal and no suicidal ideation.    Review of Systems  Constitutional: Negative.   HENT: Negative.   Eyes: Negative.   Respiratory: Negative.   Cardiovascular: Negative.   Gastrointestinal: Negative.   Musculoskeletal: Negative.   Skin: Negative.   Neurological: Negative.   Psychiatric/Behavioral: Positive for depression and substance abuse. Negative for hallucinations, memory loss and suicidal ideas. The patient is nervous/anxious and has insomnia.     Blood pressure 123/84, pulse 74, temperature 98 F (36.7 C), temperature source Oral, resp. rate 18, height 6\' 1"  (1.854 m), weight 97.5 kg, SpO2 98 %.Body mass index is 28.37 kg/m.  General Appearance: Disheveled  Eye Contact:  Minimal  Speech:  Slow  Volume:  Decreased  Mood:  Depressed  Affect:  Depressed  Thought Process:  Coherent  Orientation:  Full (Time, Place, and Person)  Thought Content:  Logical  Suicidal Thoughts:  No  Homicidal Thoughts:  No  Memory:  Immediate;   Fair Recent;   Fair Remote;   Fair  Judgement:  Fair   Insight:  Fair  Psychomotor Activity:  Decreased  Concentration:  Concentration: Fair  Recall:  [employer] of Knowledge:  Fair  Language:  Fair  Akathisia:  No  Handed:  Right  AIMS (if indicated):     Assets:  Desire for Improvement Housing  ADL's:  Impaired  Cognition:  Impaired,  Mild  Sleep:         COGNITIVE FEATURES THAT CONTRIBUTE TO RISK:  Closed-mindedness    SUICIDE RISK:   Moderate:  Frequent suicidal ideation with limited intensity, and duration, some specificity in terms of plans, no associated intent, good self-control, limited dysphoria/symptomatology, some risk factors present, and identifiable protective factors, including available and accessible social support.  PLAN OF CARE: Patient admitted to the psychiatric unit.  15-minute checks in place.  Detox orders in place.  Restart Paxil that had been used for outpatient treatment of depression.  Engage patient in individual and group therapy.  Ongoing reassessment of suicidality before discharge.  I certify that inpatient services furnished can reasonably be expected to improve the patient's condition.   Alethia Berthold, MD 08/15/2018, 5:02 PM

## 2018-08-16 ENCOUNTER — Ambulatory Visit (HOSPITAL_COMMUNITY): Payer: 59 | Admitting: Psychiatry

## 2018-08-16 DIAGNOSIS — F332 Major depressive disorder, recurrent severe without psychotic features: Principal | ICD-10-CM

## 2018-08-16 MED ORDER — PAROXETINE HCL 40 MG PO TABS: 40.0000 mg | ORAL_TABLET | Freq: Every day | ORAL | 0 refills | Status: DC

## 2018-08-16 MED ORDER — NALTREXONE HCL 50 MG PO TABS: 50.0000 mg | ORAL_TABLET | Freq: Every day | ORAL | 2 refills | Status: DC

## 2018-08-16 MED ORDER — NALTREXONE HCL 50 MG PO TABS
50.0000 mg | ORAL_TABLET | Freq: Every day | ORAL | Status: DC
  Administered 2018-08-16 – 2018-08-17 (×2): 50 mg via ORAL
  Filled 2018-08-16 (×3): qty 1

## 2018-08-16 MED ORDER — PAROXETINE HCL 40 MG PO TABS: 40.0000 mg | ORAL_TABLET | Freq: Every day | ORAL | 2 refills | Status: DC

## 2018-08-16 MED ORDER — NALTREXONE HCL 50 MG PO TABS: 50.0000 mg | ORAL_TABLET | Freq: Every day | ORAL | 0 refills | Status: DC

## 2018-08-16 NOTE — Progress Notes (Signed)
D: Patient stated slept poor last night .Stated appetite is good and energy level  Is normal. Stated concentration is good . Stated on Depression scale  8, hopeless 6 and anxiety 10.( low 0-10 high) Denies suicidal  homicidal ideations  .  No auditory hallucinations  No pain concerns . Appropriate ADL'S. Interacting with peers and staff. Instructed   patient on [facility] Education , unit programing . Patient Able to verbalize understanding of information  received staff  . Instructed patient the importance  of balance his daily activities . Working on Radiographer, therapeutic , Occupational hygienist  with medication . Interacting  with his peers Able to con troll anger  and appropriate behavior towards  issues  with no control. Able to attend unit  programing  and identify resource  to benenifit him with direction from staff.  A: Encourage patient participation with unit programming . Instruction  Given on  Medication , verbalize understanding.  R: Voice no other concerns. Staff continue to monitor

## 2018-08-16 NOTE — Tx Team (Signed)
Interdisciplinary Treatment and Diagnostic Plan Update  08/16/2018 Time of Session: 230PM Berline LopesJonathan S Farooqui MRN: [id number]  Principal Diagnosis: Severe recurrent major depression without psychotic features [facility](HCC)  Secondary Diagnoses: Principal Problem:   Severe recurrent major depression without psychotic features (HCC) Active Problems:   Opioid use disorder, severe, dependence (HCC)   Alcohol use disorder, severe, dependence (HCC)   Cocaine abuse (HCC)   Cannabis abuse   Current Medications:  Current Facility-Administered Medications  Medication Dose Route Frequency Provider Last Rate Last Dose  . acetaminophen (TYLENOL) tablet 650 mg  650 mg Oral Q6H PRN Ravi, Himabindu, MD      . alum & mag hydroxide-simeth (MAALOX/MYLANTA) 200-200-20 MG/5ML suspension 30 mL  30 mL Oral Q4H PRN Ravi, Himabindu, MD      . folic acid (FOLVITE) tablet 1 mg  1 mg Oral Daily Clapacs, Jackquline DenmarkJohn T, MD   1 mg at 08/16/18 0807  . LORazepam (ATIVAN) tablet 1 mg  1 mg Oral Q6H PRN Clapacs, Jackquline DenmarkJohn T, MD   1 mg at 08/16/18 0114   Or  . LORazepam (ATIVAN) injection 1 mg  1 mg Intravenous Q6H PRN Clapacs, John T, MD      . magnesium hydroxide (MILK OF MAGNESIA) suspension 30 mL  30 mL Oral Daily PRN Ravi, Himabindu, MD      . multivitamin with minerals tablet 1 tablet  1 tablet Oral Daily Clapacs, Jackquline DenmarkJohn T, MD   1 tablet at 08/16/18 0807  . nicotine (NICODERM CQ - dosed in mg/24 hours) patch 21 mg  21 mg Transdermal Daily Clapacs, Jackquline DenmarkJohn T, MD   21 mg at 08/16/18 [id number]  . PARoxetine (PAXIL) tablet 40 mg  40 mg Oral Daily Clapacs, Jackquline DenmarkJohn T, MD   40 mg at 08/16/18 0807  . thiamine (VITAMIN B-1) tablet 100 mg  100 mg Oral Daily Clapacs, John T, MD   100 mg at 08/16/18 [id number]   Or  . thiamine (B-1) injection 100 mg  100 mg Intravenous Daily Clapacs, Jackquline DenmarkJohn T, MD       PTA Medications: Medications Prior to Admission  Medication Sig Dispense Refill Last Dose  . acetaminophen (TYLENOL) 325 MG tablet Take 650 mg by mouth every 6  (six) hours as needed for mild pain.     Marland Kitchen. albuterol (PROVENTIL HFA;VENTOLIN HFA) 108 (90 Base) MCG/ACT inhaler Inhale 2 puffs into the lungs every 6 (six) hours as needed for wheezing or shortness of breath. (Patient not taking: Reported on 04/19/2018) 1 Inhaler 2   . cyclobenzaprine (FLEXERIL) 10 MG tablet Take 1 tablet (10 mg total) by mouth 3 (three) times daily as needed for muscle spasms. (Patient not taking: Reported on 04/19/2018) 12 tablet 0   . ibuprofen (ADVIL,MOTRIN) 200 MG tablet Take 200 mg by mouth every 6 (six) hours as needed for moderate pain.     . Multiple Vitamin (MULTIVITAMIN) tablet Take 1 tablet by mouth daily.     . naproxen (NAPROSYN) 500 MG tablet Take 1 tablet (500 mg total) by mouth 2 (two) times daily with a meal. (Patient not taking: Reported on 08/15/2018) 20 tablet 0   . oxyCODONE-acetaminophen (PERCOCET/ROXICET) 5-325 MG tablet Take 1 tablet by mouth every 6 (six) hours as needed. (Patient not taking: Reported on 04/19/2018) 15 tablet 0   . PARoxetine (PAXIL) 30 MG tablet Take 1 tablet (30 mg total) by mouth daily. 90 tablet 0   . QUEtiapine (SEROQUEL) 50 MG tablet Take 1 tablet (50 mg total) by mouth at bedtime.  90 tablet 0     Patient Stressors: Marital or family conflict Substance abuse  Patient Strengths: Ability for insight General fund of knowledge Motivation for treatment/growth Supportive family/friends  Treatment Modalities: Medication Management, Group therapy, Case management,  1 to 1 session with clinician, Psychoeducation, Recreational therapy.   Physician Treatment Plan for Primary Diagnosis: Severe recurrent major depression without psychotic features ([place]) Long Term Goal(s): Improvement in symptoms so as ready for discharge Improvement in symptoms so as ready for discharge   Short Term Goals: Ability to disclose and discuss suicidal ideas Ability to demonstrate self-control will improve Compliance with prescribed medications will  improve Ability to identify triggers associated with substance abuse/mental health issues will improve  Medication Management: Evaluate patient's response, side effects, and tolerance of medication regimen.  Therapeutic Interventions: 1 to 1 sessions, Unit Group sessions and Medication administration.  Evaluation of Outcomes: Progressing  Physician Treatment Plan for Secondary Diagnosis: Principal Problem:   Severe recurrent major depression without psychotic features ([place]) Active Problems:   Opioid use disorder, severe, dependence (HCC)   Alcohol use disorder, severe, dependence ([place])   Cocaine abuse ([place])   Cannabis abuse  Long Term Goal(s): Improvement in symptoms so as ready for discharge Improvement in symptoms so as ready for discharge   Short Term Goals: Ability to disclose and discuss suicidal ideas Ability to demonstrate self-control will improve Compliance with prescribed medications will improve Ability to identify triggers associated with substance abuse/mental health issues will improve     Medication Management: Evaluate patient's response, side effects, and tolerance of medication regimen.  Therapeutic Interventions: 1 to 1 sessions, Unit Group sessions and Medication administration.  Evaluation of Outcomes: Progressing   RN Treatment Plan for Primary Diagnosis: Severe recurrent major depression without psychotic features ([place]) Long Term Goal(s): Knowledge of disease and therapeutic regimen to maintain health will improve  Short Term Goals: Ability to demonstrate self-control, Ability to participate in decision making will improve and Ability to identify and develop effective coping behaviors will improve  Medication Management: RN will administer medications as ordered by provider, will assess and evaluate patient's response and provide education to patient for prescribed medication. RN will report any adverse and/or side effects to prescribing  provider.  Therapeutic Interventions: 1 on 1 counseling sessions, Psychoeducation, Medication administration, Evaluate responses to treatment, Monitor vital signs and CBGs as ordered, Perform/monitor CIWA, COWS, AIMS and Fall Risk screenings as ordered, Perform wound care treatments as ordered.  Evaluation of Outcomes: Progressing   LCSW Treatment Plan for Primary Diagnosis: Severe recurrent major depression without psychotic features ([place]) Long Term Goal(s): Safe transition to appropriate next level of care at discharge, Engage patient in therapeutic group addressing interpersonal concerns.  Short Term Goals: Engage patient in aftercare planning with referrals and resources, Increase emotional regulation, Identify triggers associated with mental health/substance abuse issues and Increase skills for wellness and recovery  Therapeutic Interventions: Assess for all discharge needs, 1 to 1 time with Social worker, Explore available resources and support systems, Assess for adequacy in community support network, Educate family and significant other(s) on suicide prevention, Complete Psychosocial Assessment, Interpersonal group therapy.  Evaluation of Outcomes: Progressing   Progress in Treatment: Attending groups: Yes. Participating in groups: Yes. Taking medication as prescribed: Yes. Toleration medication: Yes. Family/Significant other contact made: Yes, individual(s) contacted:  pts fiance Patient understands diagnosis: Yes. Discussing patient identified problems/goals with staff: Yes. Medical problems stabilized or resolved: Yes. Denies suicidal/homicidal ideation: Yes. Issues/concerns per patient self-inventory: No. Other: N/A  New  problem(s) identified: No, Describe:  none  New Short Term/Long Term Goal(s):Detox,  medication management for mood stabilization;  development of comprehensive mental wellness/sobriety plan.   Patient Goals:  "to walk out of here and stay sober and to  get on medication to keep me away from the urges to drink"  Discharge Plan or Barriers: SPE pamphlet, Mobile Crisis information, and AA/NA information provided to patient for additional community support and resources. Pt has an appointment scheduled with [facility] for 08/22/18 at 12PM.  Reason for Continuation of Hospitalization: Medication stabilization Withdrawal symptoms  Estimated Length of Stay: Tomorrow 08/17/18  Attendees: Patient: Leslie DalesJonathan Koziol 08/16/2018 2:56 PM  Physician: Dr Toni Amendlapacs MD 08/16/2018 2:56 PM  Nursing:  08/16/2018 2:56 PM  RN Care Manager: 08/16/2018 2:56 PM  Social Worker: Zollie Scalelivia Onya Eutsler LCSW 08/16/2018 2:56 PM  Recreational Therapist: Garret ReddishShay Outlaw CTRS LRT 08/16/2018 2:56 PM  Other: Penni HomansMichaela Stanfield LCSW 08/16/2018 2:56 PM  Other: Lowella Dandyarren Livingston LCSW 08/16/2018 2:56 PM  Other: 08/16/2018 2:56 PM    Scribe for Treatment Team: Charlann Langelivia K Smith Mcnicholas, LCSW 08/16/2018 2:56 PM

## 2018-08-16 NOTE — BHH Counselor (Signed)
CSW provided patient with resource pamphlet on local [place] meetings.  Assunta Curtis, MSW, LCSW 08/16/2018 11:40 AM

## 2018-08-16 NOTE — Progress Notes (Signed)
D - Patient was in his room upon arrival to the unit. Patient was pleasant during assessment and medication administration. Patient denies SI/HI/AVH and pain with this Probation officer. Patient said his depression and anxiety were because he was in this place. Patient was isolative to his room this evening.   A - Patient compliant with medication administration per MD orders and procedures on the unit. Patient given education Patient given support and encouragement to be active in his treatment plan. Patient informed to let staff know if there are any issues or problems on the unit.   R - Patient being monitored Q 15 minutes for safety per unit protocol. Patient remains safe on the unit.

## 2018-08-16 NOTE — BHH Suicide Risk Assessment (Signed)
[place] INPATIENT:  Family/Significant Other Suicide Prevention Education  Suicide Prevention Education:  Education Completed; [name], fiance, [phone]  has been identified by the patient as the family member/significant other with whom the patient will be residing, and identified as the person(s) who will aid the patient in the event of a mental health crisis (suicidal ideations/suicide attempt).  With written consent from the patient, the family member/significant other has been provided the following suicide prevention education, prior to the and/or following the discharge of the patient.  The suicide prevention education provided includes the following:  Suicide risk factors  Suicide prevention and interventions  National Suicide Hotline telephone number  [facility] assessment telephone number  [facility] Emergency Assistance [employer] and/or Residential Mobile Crisis Unit telephone number  Request made of family/significant other to:  Remove weapons (e.g., guns, rifles, knives), all items previously/currently identified as safety concern.    Remove drugs/medications (over-the-counter, prescriptions, illicit drugs), all items previously/currently identified as a safety concern.  The family member/significant other verbalizes understanding of the suicide prevention education information provided.  The family member/significant other agrees to remove the items of safety concern listed above.  Fiance reports that patient has a history of alcohol addiction.  She reports other triggers "ex-wife keeping his child from him for 6  Months, his best friend died, his mother has been toxic his whole life".  She reports "somedays he can control it". Fiance reports "he has never told me that he wanted to get any treatment so I am very proud of him for doing this on his own".    Rozann Lesches 08/16/2018, 9:33 AM

## 2018-08-16 NOTE — Plan of Care (Signed)
Patient newly admitted, hasn't had time to progress.   Problem: Education: Goal: Knowledge of [place] General Education information/materials will improve Outcome: Not Progressing Goal: Emotional status will improve Outcome: Not Progressing Goal: Mental status will improve Outcome: Not Progressing Goal: Verbalization of understanding the information provided will improve Outcome: Not Progressing   Problem: Safety: Goal: Periods of time without injury will increase Outcome: Not Progressing

## 2018-08-16 NOTE — BHH Group Notes (Signed)
LCSW Group Therapy Note  08/16/2018 11:54 AM  Type of Therapy/Topic:  Group Therapy:  Emotion Regulation  Participation Level:  Minimal   Description of Group:   The purpose of this group is to assist patients in learning to regulate negative emotions and experience positive emotions. Patients will be guided to discuss ways in which they have been vulnerable to their negative emotions. These vulnerabilities will be juxtaposed with experiences of positive emotions or situations, and patients will be challenged to use positive emotions to combat negative ones. Special emphasis will be placed on coping with negative emotions in conflict situations, and patients will process healthy conflict resolution skills.  Therapeutic Goals: 1. Patient will identify two positive emotions or experiences to reflect on in order to balance out negative emotions 2. Patient will label two or more emotions that they find the most difficult to experience 3. Patient will demonstrate positive conflict resolution skills through discussion and/or role plays  Summary of Patient Progress: Pt was present in group and engaged in the discussion a little bit. Pt reported that he is in the hospital due to drinking and reports that he typically drinks when he does not like the outcome of something. Pt struggled to identify any coping skills he could use to regulate his negative emotions.   Therapeutic Modalities:   Cognitive Behavioral Therapy Feelings Identification Dialectical Behavioral Therapy   Evalina Field, MSW, LCSW Clinical Social Work 08/16/2018 11:54 AM

## 2018-08-16 NOTE — Progress Notes (Signed)
[facility]  08/16/2018 4:05 PM Justin LopesJonathan S Donovan  MRN:  [id number] Subjective: Follow-up patient with depression and alcohol and opiate abuse problems.  Patient says his mood is much better.  Denies any suicidal thoughts at all.  Says he feels optimistic about staying sober outside the hospital.  He wanted to discuss medications that could potentially help him to stay sober.  We discussed the 3 medicines that are available to treat alcohol dependence.  I explained to him that Antabuse was in my opinion too dangerous and not appropriate for him, a acamprosate was likely to be far too expensive.  Patient had already discussed naltrexone with his outpatient psychiatrist.  I explained that one drawback to this medicine is that it rendered narcotic medications ineffective.  Nevertheless the patient is now asking to start this medicine saying he feels that it would be important for his fiance to see him taking "something" to help him stop drinking. Principal Problem: Severe recurrent major depression without psychotic features (HCC) Diagnosis: Principal Problem:   Severe recurrent major depression without psychotic features (HCC) Active Problems:   Opioid use disorder, severe, dependence (HCC)   Alcohol use disorder, severe, dependence (HCC)   Cocaine abuse (HCC)   Cannabis abuse  Total Time spent with patient: 30 minutes  Past Psychiatric History: Past history of alcohol abuse and depression  Past Medical History:  Past Medical History:  Diagnosis Date  . Anxiety   . Asthma    as a child  . Bipolar disorder (HCC)    "supposed to take Latuda, but it is too expensive"  . Closed head injury with concussion   . Complication of anesthesia    "I woke up from surgery in April (23, 2019) very mean"  . Depression   . History of kidney stones     passed x2 lithotripsy    Past Surgical History:  Procedure Laterality Date  . AMPUTATION Right 05/24/2017   Procedure: LITTLE FINGER REPAIR  EXTENSOR AND FLEXOR TENDON, LATERAL LIGAMENT REPAIR, NERVE REPAIR, JOINT PINNING;  Surgeon: Dominica SeverinGramig, William, MD;  Location: [facility] OR;  Service: Orthopedics;  Laterality: Right;  . CHOLECYSTECTOMY    . CYSTOSCOPY W/ STONE MANIPULATION    . FOOT SURGERY Right    fracture  . HARDWARE REMOVAL Right 07/12/2017   Procedure: Right small finger hardware removal, pin removal;  Surgeon: Dominica SeverinGramig, William, MD;  Location: [facility] OR;  Service: Orthopedics;  Laterality: Right;  45 mins  . LITHOTRIPSY     Family History:  Family History  Problem Relation Age of Onset  . Depression Mother   . Alcohol abuse Maternal Uncle   . Depression Maternal Uncle    Family Psychiatric  History: See previous Social History:  Social History   Substance and Sexual Activity  Alcohol Use Yes  . Alcohol/week: 42.0 standard drinks  . Types: 42 Cans of beer per week   Comment: 5 days clean, 6 pack a day prior to that time.      Social History   Substance and Sexual Activity  Drug Use Yes  . Frequency: 7.0 times per week  . Types: Marijuana    Social History   Socioeconomic History  . Marital status: Legally Separated    Spouse name: Not on file  . Number of children: Not on file  . Years of education: Not on file  . Highest education level: Not on file  Occupational History  . Not on file  Social Needs  . Physicist, medicalinancial resource  strain: Not on file  . Food insecurity    Worry: Not on file    Inability: Not on file  . Transportation needs    Medical: Not on file    Non-medical: Not on file  Tobacco Use  . Smoking status: Current Every Day Smoker    Packs/day: 1.50    Years: 14.00    Pack years: 21.00    Types: Cigarettes  . Smokeless tobacco: Never Used  . Tobacco comment: Reports has cut back to 1 pk a day, has Chantix but has not started.   Substance and Sexual Activity  . Alcohol use: Yes    Alcohol/week: 42.0 standard drinks    Types: 42 Cans of beer per week    Comment: 5 days clean, 6 pack a day  prior to that time.   . Drug use: Yes    Frequency: 7.0 times per week    Types: Marijuana  . Sexual activity: Yes    Partners: Female    Birth control/protection: None  Lifestyle  . Physical activity    Days per week: Not on file    Minutes per session: Not on file  . Stress: Not on file  Relationships  . Social Herbalist on phone: Not on file    Gets together: Not on file    Attends religious service: Not on file    Active member of club or organization: Not on file    Attends meetings of clubs or organizations: Not on file    Relationship status: Not on file  Other Topics Concern  . Not on file  Social History Narrative  . Not on file   Additional Social History:    Pain Medications: see PTA Prescriptions: see PTA Over the Counter: see PTA History of alcohol / drug use?: Yes                    Sleep: Fair  Appetite:  Fair  Current Medications: Current Facility-Administered Medications  Medication Dose Route Frequency Provider Last Rate Last Dose  . acetaminophen (TYLENOL) tablet 650 mg  650 mg Oral Q6H PRN Ravi, Himabindu, MD      . alum & mag hydroxide-simeth (MAALOX/MYLANTA) 200-200-20 MG/5ML suspension 30 mL  30 mL Oral Q4H PRN Ravi, Himabindu, MD      . folic acid (FOLVITE) tablet 1 mg  1 mg Oral Daily Arlo Buffone, Madie Reno, MD   1 mg at 08/16/18 0807  . LORazepam (ATIVAN) tablet 1 mg  1 mg Oral Q6H PRN Yakelin Grenier, Madie Reno, MD   1 mg at 08/16/18 0114   Or  . LORazepam (ATIVAN) injection 1 mg  1 mg Intravenous Q6H PRN Angelia Hazell T, MD      . magnesium hydroxide (MILK OF MAGNESIA) suspension 30 mL  30 mL Oral Daily PRN Ravi, Himabindu, MD      . multivitamin with minerals tablet 1 tablet  1 tablet Oral Daily Shamari Lofquist T, MD   1 tablet at 08/16/18 1478  . naltrexone (DEPADE) tablet 50 mg  50 mg Oral Daily Himmat Enberg T, MD      . nicotine (NICODERM CQ - dosed in mg/24 hours) patch 21 mg  21 mg Transdermal Daily Arlis Everly, Madie Reno, MD   21 mg at  08/16/18 2956  . PARoxetine (PAXIL) tablet 40 mg  40 mg Oral Daily Tomasina Keasling, Madie Reno, MD   40 mg at 08/16/18 0807  . thiamine (VITAMIN B-1) tablet 100  mg  100 mg Oral Daily Jamille Yoshino T, MD   100 mg at 08/16/18 16100807   Or  . thiamine (B-1) injection 100 mg  100 mg Intravenous Daily Harlon Kutner, Jackquline DenmarkJohn T, MD        Lab Results: No results found for this or any previous visit (from the past 48 hour(s)).  Blood Alcohol level:  Lab Results  Component Value Date   ETH <10 08/13/2018   ETH <5 03/25/2015    Metabolic Disorder Labs: No results found for: HGBA1C, MPG No results found for: PROLACTIN No results found for: CHOL, TRIG, HDL, CHOLHDL, VLDL, LDLCALC  Physical Findings: AIMS:  , ,  ,  ,    CIWA:  CIWA-Ar Total: 0 COWS:  COWS Total Score: 0  Musculoskeletal: Strength & Muscle Tone: within normal limits Gait & Station: normal Patient leans: N/A  Psychiatric Specialty Exam: Physical Exam  Nursing note and vitals reviewed. Constitutional: He appears well-developed and well-nourished.  HENT:  Head: Normocephalic and atraumatic.  Eyes: Pupils are equal, round, and reactive to light. Conjunctivae are normal.  Neck: Normal range of motion.  Cardiovascular: Regular rhythm and normal heart sounds.  Respiratory: Effort normal. No respiratory distress.  GI: Soft.  Musculoskeletal: Normal range of motion.  Neurological: He is alert.  Skin: Skin is warm and dry.  Psychiatric: Judgment normal. His affect is blunt. His speech is delayed. He is slowed. Thought content is not paranoid. Cognition and memory are normal. He expresses no homicidal and no suicidal ideation.    Review of Systems  Constitutional: Negative.   HENT: Negative.   Eyes: Negative.   Respiratory: Negative.   Cardiovascular: Negative.   Gastrointestinal: Negative.   Musculoskeletal: Negative.   Skin: Negative.   Neurological: Negative.   Psychiatric/Behavioral: Negative.     Blood pressure (!) 137/93, pulse 83,  temperature 98.8 F (37.1 C), temperature source Oral, resp. rate 16, height 6\' 1"  (1.854 m), weight 97.5 kg, SpO2 100 %.Body mass index is 28.37 kg/m.  General Appearance: Casual  Eye Contact:  Good  Speech:  Slow  Volume:  Decreased  Mood:  Euthymic  Affect:  Congruent  Thought Process:  Goal Directed  Orientation:  Full (Time, Place, and Person)  Thought Content:  Logical  Suicidal Thoughts:  No  Homicidal Thoughts:  No  Memory:  Immediate;   Fair Recent;   Fair Remote;   Fair  Judgement:  Fair  Insight:  Fair  Psychomotor Activity:  Decreased  Concentration:  Concentration: Fair  Recall:  [employer]Fair  Fund of Knowledge:  Fair  Language:  Fair  Akathisia:  No  Handed:  Right  AIMS (if indicated):     Assets:  Desire for Improvement Housing Physical Health  ADL's:  Intact  Cognition:  WNL  Sleep:  Number of Hours: 6.5     Treatment Plan Summary: Daily contact with patient to assess and evaluate symptoms and progress in treatment, Medication management and Plan I explained to the patient that I had increased his peroxide teen as previously suggested by his outpatient psychiatrist.  I also am agreeable to starting the naltrexone.  It turns out that he can probably afford it if he uses a discount coupon card.  I explained to him the crucial fact that it will render narcotics including Suboxone ineffective.  Despite this he wishes to proceed.  Patient is currently denying suicidal ideation and appears safe and stable.  Likely discharge tomorrow.  Mordecai RasmussenJohn Dawud Mays, MD 08/16/2018, 4:05 PM

## 2018-08-16 NOTE — Progress Notes (Signed)
Pt visible in the milieu at the beginning of the shift. Pt then demanded to be discharged home now because he "just spoke to his wife and kids and they want him back now". Pt reminded that his discharge was scheduled for 7/16 by the MD. Pt states that he is aware but he will like to leave now because he voluntarily checked himself in and he is now requesting to be checked out, so he can go home. Pt states that he has a car parked outside waiting for him.   Pt later request to take some medication to calm him down his agitation (involuntary shaking movement of his legs) and anxiety. Ativan given per CIWA protocol. Pt wandered around a bit and went to his room.   Pt out in his room 58mins later requesting for blankets because he is feeling cold. 2 extra blankets provided for warmth.

## 2018-08-16 NOTE — Plan of Care (Signed)
Instructed   patient on [facility] Education , unit programing . Patient Able to verbalize understanding of information  received staff  . Instructed patient the importance  of balance his daily activities . Working on Radiographer, therapeutic , Occupational hygienist  with medication . Interacting  with his peers Able to con troll anger  and appropriate behavior towards  issues  with no control. Able to attend unit  programing  and identify resource  to benenifit him with direction from staff. Problem: Health Behavior/Discharge Planning: Goal: Identification of resources available to assist in meeting health care needs will improve Outcome: Progressing   Problem: Physical Regulation: Goal: Complications related to the disease process, condition or treatment will be avoided or minimized Outcome: Progressing   Problem: Health Behavior/Discharge Planning: Goal: Ability to identify changes in lifestyle to reduce recurrence of condition will improve Outcome: Progressing   Problem: Health Behavior/Discharge Planning: Goal: Compliance with therapeutic regimen will improve Outcome: Progressing   Problem: Safety: Goal: Periods of time without injury will increase Outcome: Progressing   Problem: Education: Goal: Knowledge of [place] [facility] Education information/materials will improve Outcome: Progressing Goal: Emotional status will improve Outcome: Progressing Goal: Mental status will improve Outcome: Progressing Goal: Verbalization of understanding the information provided will improve Outcome: Progressing   Working on coping skills , decision making Compliant  with medication . Interacting  with his peers Able to con troll anger  and appropriate behavior towards  issues  with no control. Able to attend unit  programing  and identify resource  to benenifit him with direction from staff.
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
## 2018-08-16 NOTE — BHH Suicide Risk Assessment (Signed)
[facility] Discharge Suicide Risk Assessment   Principal Problem: Severe recurrent major depression without psychotic features [facility]) Discharge Diagnoses: Principal Problem:   Severe recurrent major depression without psychotic features ([place]) Active Problems:   Opioid use disorder, severe, dependence ([place])   Alcohol use disorder, severe, dependence ([place])   Cocaine abuse ([place])   Cannabis abuse   Total Time spent with patient: 30 minutes  Musculoskeletal: Strength & Muscle Tone: within normal limits Gait & Station: normal Patient leans: N/A  Psychiatric Specialty Exam: Review of Systems  Constitutional: Negative.   HENT: Negative.   Eyes: Negative.   Respiratory: Negative.   Cardiovascular: Negative.   Gastrointestinal: Negative.   Musculoskeletal: Negative.   Skin: Negative.   Neurological: Negative.   Psychiatric/Behavioral: Negative.     Blood pressure (!) 137/93, pulse 83, temperature 98.8 F (37.1 C), temperature source Oral, resp. rate 16, height 6\' 1"  (1.854 m), weight 97.5 kg, SpO2 100 %.Body mass index is 28.37 kg/m.  General Appearance: Casual  Eye Contact::  Fair  Speech:  Clear and [id number]  Volume:  Normal  Mood:  Euthymic  Affect:  Congruent  Thought Process:  Goal Directed  Orientation:  Full (Time, Place, and Person)  Thought Content:  Logical  Suicidal Thoughts:  No  Homicidal Thoughts:  No  Memory:  Immediate;   Fair Recent;   Fair Remote;   Fair  Judgement:  Fair  Insight:  Fair  Psychomotor Activity:  Decreased  Concentration:  Fair  Recall:  [employer] of Knowledge:Fair  Language: Fair  Akathisia:  No  Handed:  Right  AIMS (if indicated):     Assets:  Desire for Improvement Housing Physical Health  Sleep:  Number of Hours: 6.5  Cognition: WNL  ADL's:  Intact   Mental Status Per Nursing Assessment::   On Admission:  NA  Demographic Factors:  Male  Loss Factors: Loss of significant relationship  Historical  Factors: Impulsivity  Risk Reduction Factors:   Responsible for children under 45 years of age, Sense of responsibility to family, Religious beliefs about death, Living with another person, especially a relative, Positive social support and Positive therapeutic relationship  Continued Clinical Symptoms:  Alcohol/Substance Abuse/Dependencies  Cognitive Features That Contribute To Risk:  Polarized thinking    Suicide Risk:  Minimal: No identifiable suicidal ideation.  Patients presenting with no risk factors but with morbid ruminations; may be classified as minimal risk based on the severity of the depressive symptoms  Follow-up Information    [facility] on 08/22/2018.   Why: Please follow up at [facility] on Tuesday, August 22, 2018 at 12pm. Please bring your ID, insurance card and hospital discharge paperwork. Thank you. Contact information: [place] [phone]           Plan Of Care/Follow-up recommendations:  Activity:  Activity as tolerated Diet:  Regular diet Other:  Follow-up outpatient treatment with [facility] or with previous psychiatrist  Alethia Berthold, MD 08/16/2018, 4:11 PM

## 2018-08-16 NOTE — Progress Notes (Signed)
Recreation Therapy Notes    Date: 08/16/2018  Time: 9:30 am  Location: Craft room  Behavioral response: Appropriate   Intervention Topic: Stress  Discussion/Intervention:  Group content on today was focused on stress. The group defined stress and way to cope with stress. Participants expressed how they know when they are stresses out. Individuals described the different ways they have to cope with stress. The group stated reasons why it is important to cope with stress. Patient explained what good stress is and some examples. The group participated in the intervention "Managing Stress". Individuals were separated into two group and answered questions related to stress.  Clinical Observations/Feedback:  Patient came to group late and did not contribute to group in any way.    Shakenna Herrero LRT/CTRS         Shaelee Forni 08/16/2018 11:45 AM

## 2018-08-17 MED ORDER — QUETIAPINE FUMARATE 100 MG PO TABS: 100.0000 mg | ORAL_TABLET | Freq: Every day | ORAL | 2 refills | Status: DC

## 2018-08-17 MED ORDER — QUETIAPINE FUMARATE 100 MG PO TABS: 100.0000 mg | ORAL_TABLET | Freq: Every day | ORAL | Status: DC

## 2018-08-17 NOTE — Plan of Care (Signed)
  Problem: Education: Goal: Knowledge of [facility] Education information/materials will improve Outcome: Adequate for Discharge Goal: Emotional status will improve Outcome: Adequate for Discharge Goal: Mental status will improve Outcome: Adequate for Discharge Goal: Verbalization of understanding the information provided will improve Outcome: Adequate for Discharge   Problem: Safety: Goal: Periods of time without injury will increase Outcome: Adequate for Discharge   Problem: Health Behavior/Discharge Planning: Goal: Compliance with therapeutic regimen will improve Outcome: Adequate for Discharge   Problem: Health Behavior/Discharge Planning: Goal: Ability to identify changes in lifestyle to reduce recurrence of condition will improve Outcome: Adequate for Discharge   Problem: Physical Regulation: Goal: Complications related to the disease process, condition or treatment will be avoided or minimized Outcome: Adequate for Discharge   Problem: Health Behavior/Discharge Planning: Goal: Identification of resources available to assist in meeting health care needs will improve Outcome: Adequate for Discharge   Problem: Health Behavior/Discharge Planning: Goal: Identification of resources available to assist in meeting health care needs will improve Outcome: Adequate for Discharge

## 2018-08-17 NOTE — Progress Notes (Signed)
  [facility] Adult Case Management Discharge Plan :  Will you be returning to the same living situation after discharge:  Yes,  pt is returning to the home with his fiance.  At discharge, do you have transportation home?: Yes,  pt drove his own car to the hospital.  Do you have the ability to pay for your medications: No.  Release of information consent forms completed and in the chart;  Patient's signature needed at discharge.  Patient to Follow up at: Follow-up Information    [facility] on 08/22/2018.   Why: Please follow up at [facility] on Tuesday, August 22, 2018 at 12pm. Please bring your ID, insurance card and hospital discharge paperwork. Thank you. Contact information: [place] [address] [phone]           Next level of care provider has access to [place] and Suicide Prevention discussed: Yes,  SPE completed with the patient's fiance.   Have you used any form of tobacco in the last 30 days? (Cigarettes, Smokeless Tobacco, Cigars, and/or Pipes): Yes  Has patient been referred to the Quitline?: Patient refused referral  Patient has been referred for addiction treatment: Pt. refused referral  Rozann Lesches, LCSW 08/17/2018, 9:54 AM

## 2018-08-17 NOTE — Discharge Summary (Signed)
Physician Discharge Summary Note  Patient:  Justin LopesJonathan S Donovan is an 33 y.o., male MRN:  [id number] DOB:  [birth year] Patient phone:  [phone] (home)  Patient address:   [address] [place] [id number],  Total Time spent with patient: 45 minutes  Date of Admission:  08/15/2018 Date of Discharge: August 17, 2018  Reason for Admission: Patient admitted to the hospital where he presented agitated with irritability intoxication statements of suicidality.  Principal Problem: Severe recurrent major depression without psychotic features [facility](HCC) Discharge Diagnoses: Principal Problem:   Severe recurrent major depression without psychotic features (HCC) Active Problems:   Opioid use disorder, severe, dependence (HCC)   Alcohol use disorder, severe, dependence (HCC)   Cocaine abuse (HCC)   Cannabis abuse   Past Psychiatric History: Patient has a long history of substance abuse including opiate abuse which had been thought to be under control as well as recent heavy alcohol abuse and occasional abuse of cocaine.  He has [place] in [place] and been on multiple medicines for mood stability and depression  Past Medical History:  Past Medical History:  Diagnosis Date  . Anxiety   . Asthma    as a child  . Bipolar disorder (HCC)    "supposed to take Latuda, but it is too expensive"  . Closed head injury with concussion   . Complication of anesthesia    "I woke up from surgery in April (23, 2019) very mean"  . Depression   . History of kidney stones     passed x2 lithotripsy    Past Surgical History:  Procedure Laterality Date  . AMPUTATION Right 05/24/2017   Procedure: LITTLE FINGER REPAIR EXTENSOR AND FLEXOR TENDON, LATERAL LIGAMENT REPAIR, NERVE REPAIR, JOINT PINNING;  Surgeon: Dominica SeverinGramig, William, MD;  Location: [facility] OR;  Service: Orthopedics;  Laterality: Right;  . CHOLECYSTECTOMY    . CYSTOSCOPY W/ STONE MANIPULATION    . FOOT SURGERY Right     fracture  . HARDWARE REMOVAL Right 07/12/2017   Procedure: Right small finger hardware removal, pin removal;  Surgeon: Dominica SeverinGramig, William, MD;  Location: [facility] OR;  Service: Orthopedics;  Laterality: Right;  45 mins  . LITHOTRIPSY     Family History:  Family History  Problem Relation Age of Onset  . Depression Mother   . Alcohol abuse Maternal Uncle   . Depression Maternal Uncle    Family Psychiatric  History: Positive for alcohol abuse and depression Social History:  Social History   Substance and Sexual Activity  Alcohol Use Yes  . Alcohol/week: 42.0 standard drinks  . Types: 42 Cans of beer per week   Comment: 5 days clean, 6 pack a day prior to that time.      Social History   Substance and Sexual Activity  Drug Use Yes  . Frequency: 7.0 times per week  . Types: Marijuana    Social History   Socioeconomic History  . Marital status: Legally Separated    Spouse name: Not on file  . Number of children: Not on file  . Years of education: Not on file  . Highest education level: Not on file  Occupational History  . Not on file  Social Needs  . Financial resource strain: Not on file  . Food insecurity    Worry: Not on file    Inability: Not on file  . Transportation needs    Medical: Not on file    Non-medical: Not on file  Tobacco Use  .  Smoking status: Current Every Day Smoker    Packs/day: 1.50    Years: 14.00    Pack years: 21.00    Types: Cigarettes  . Smokeless tobacco: Never Used  . Tobacco comment: Reports has cut back to 1 pk a day, has Chantix but has not started.   Substance and Sexual Activity  . Alcohol use: Yes    Alcohol/week: 42.0 standard drinks    Types: 42 Cans of beer per week    Comment: 5 days clean, 6 pack a day prior to that time.   . Drug use: Yes    Frequency: 7.0 times per week    Types: Marijuana  . Sexual activity: Yes    Partners: Female    Birth control/protection: None  Lifestyle  . Physical activity    Days per week: Not on  file    Minutes per session: Not on file  . Stress: Not on file  Relationships  . Social Musicianconnections    Talks on phone: Not on file    Gets together: Not on file    Attends religious service: Not on file    Active member of club or organization: Not on file    Attends meetings of clubs or organizations: Not on file    Relationship status: Not on file  Other Topics Concern  . Not on file  Social History Narrative  . Not on file    Hospital Course: Patient admitted to psychiatric ward.  15-minute checks employed.  Patient did not attempt to harm himself or get aggressive with anyone in the hospital.  Initially presented as very distraught but calm down after the first night.  He was agreeable to continuing peroxide teen as previously prescribed and increasing dose to 40 mg a day which had been recommended by his outpatient psychiatrist.  We also discussed continuing Seroquel and slightly increasing the dose for sleep anxiety and adjunctive treatment of depression.  Patient was very focused on getting on "something" to help him with "cravings".  We reviewed the medications that can be used for alcohol dependence.  The only 1 that seemed appropriate potentially would be naltrexone.  Patient was counseled that this medicines inevitable affects included making narcotic medications impotent which could be problematic if he had acute pain but also given that he had continued to take Suboxone intermittently as an outpatient.  Patient nevertheless strongly requested this medication.  Order was initiated.  Patient at the time of discharge is denying suicidal ideation denying homicidal ideation does not appear to be actively psychotic.  He was irritable last night apparently after having a talk with his girlfriend's family at home.  This morning however he is calm enough and does not appear to be acutely dangerous and is strongly requesting to follow through on discharge.  He has been counseled about the risks  of ongoing substance abuse and avoidance of treating his mood.  Patient at this time no longer meets commitment criteria he will be discharged with prescriptions for his medicine and outpatient treatment as previously arranged.  Physical Findings: AIMS:  , ,  ,  ,    CIWA:  CIWA-Ar Total: 2 COWS:  COWS Total Score: 0  Musculoskeletal: Strength & Muscle Tone: within normal limits Gait & Station: normal Patient leans: N/A  Psychiatric Specialty Exam: Physical Exam  Nursing note and vitals reviewed. Constitutional: He appears well-developed and well-nourished.  HENT:  Head: Normocephalic and atraumatic.  Eyes: Pupils are equal, round, and reactive to light.  Conjunctivae are normal.  Neck: Normal range of motion.  Cardiovascular: Regular rhythm and normal heart sounds.  Respiratory: Effort normal. No respiratory distress.  GI: Soft.  Musculoskeletal: Normal range of motion.  Neurological: He is alert.  Skin: Skin is warm and dry.  Psychiatric: His speech is normal and behavior is normal. Judgment and thought content normal. His mood appears anxious. Cognition and memory are normal.    Review of Systems  Constitutional: Negative.   HENT: Negative.   Eyes: Negative.   Respiratory: Negative.   Cardiovascular: Negative.   Gastrointestinal: Negative.   Musculoskeletal: Negative.   Skin: Negative.   Neurological: Negative.   Psychiatric/Behavioral: Negative for depression, hallucinations, memory loss, substance abuse and suicidal ideas. The patient is nervous/anxious and has insomnia.     Blood pressure (!) 141/89, pulse 75, temperature 97.9 F (36.6 C), temperature source Oral, resp. rate 18, height 6\' 1"  (1.854 m), weight 97.5 kg, SpO2 100 %.Body mass index is 28.37 kg/m.  General Appearance: Casual  Eye Contact:  Good  Speech:  Slow  Volume:  Normal  Mood:  Euthymic  Affect:  Constricted  Thought Process:  Coherent  Orientation:  Full (Time, Place, and Person)  Thought  Content:  Logical  Suicidal Thoughts:  No  Homicidal Thoughts:  No  Memory:  Immediate;   Fair Recent;   Fair Remote;   Fair  Judgement:  Impaired  Insight:  Shallow  Psychomotor Activity:  Decreased  Concentration:  Concentration: Fair  Recall:  Fis[employer]Fair  Fund of Knowledge:  Fair  Language:  Fair  Akathisia:  No  Handed:  Right  AIMS (if indicated):     Assets:  Housing Physical Health Social Support  ADL's:  Intact  Cognition:  WNL  Sleep:  Number of Hours: 2.45     Have you used any form of tobacco in the last 30 days? (Cigarettes, Smokeless Tobacco, Cigars, and/or Pipes): Yes  Has this patient used any form of tobacco in the last 30 days? (Cigarettes, Smokeless Tobacco, Cigars, and/or Pipes) Yes, Yes, A prescription for an FDA-approved tobacco cessation medication was offered at discharge and the patient refused  Blood Alcohol level:  Lab Results  Component Value Date   ETH <10 08/13/2018   ETH <5 03/25/2015    Metabolic Disorder Labs:  No results found for: HGBA1C, MPG No results found for: PROLACTIN No results found for: CHOL, TRIG, HDL, CHOLHDL, VLDL, LDLCALC  See Psychiatric Specialty Exam and Suicide Risk Assessment completed by Attending Physician prior to discharge.  Discharge destination:  Home  Is patient on multiple antipsychotic therapies at discharge:  No   Has Patient had three or more failed trials of antipsychotic monotherapy by history:  No  Recommended Plan for Multiple Antipsychotic Therapies: NA  Discharge Instructions    Diet - low sodium heart healthy   Complete by: As directed    Increase activity slowly   Complete by: As directed      Allergies as of 08/17/2018   No Known Allergies     Medication List    STOP taking these medications   acetaminophen 325 MG tablet Commonly known as: TYLENOL   albuterol 108 (90 Base) MCG/ACT inhaler Commonly known as: VENTOLIN HFA   cyclobenzaprine 10 MG tablet Commonly known as: FLEXERIL    ibuprofen 200 MG tablet Commonly known as: ADVIL   multivitamin tablet   naproxen 500 MG tablet Commonly known as: Naprosyn   oxyCODONE-acetaminophen 5-325 MG tablet Commonly known as: PERCOCET/ROXICET  TAKE these medications     Indication  naltrexone 50 MG tablet Commonly known as: DEPADE Take 1 tablet (50 mg total) by mouth daily.  Indication: Abuse or Misuse of Alcohol   PARoxetine 40 MG tablet Commonly known as: PAXIL Take 1 tablet (40 mg total) by mouth daily. What changed:   medication strength  how much to take  Indication: Major Depressive Disorder   QUEtiapine 100 MG tablet Commonly known as: SEROQUEL Take 1 tablet (100 mg total) by mouth at bedtime. What changed:   medication strength  how much to take  Indication: Depressive Phase of Manic-Depression, Major Depressive Disorder      Follow-up Information    [facility] on 08/22/2018.   Why: Please follow up at [facility] on Tuesday, August 22, 2018 at 12pm. Please bring your ID, insurance card and hospital discharge paperwork. Thank you. Contact information: [place] [address] [phone]           Follow-up recommendations:  Activity:  Activity as tolerated Diet:  Regular diet Other:  Follow-up with outpatient treatment at [facility].  Continue current medicines.  Get involved in outpatient substance abuse treatment including 12-step groups as soon as possible.  Comments: Patient currently is lucid denies any suicidal thoughts and no longer appears to be committable and agrees to outpatient treatment  Signed: Alethia Berthold, MD 08/17/2018, 9:55 AM

## 2018-08-17 NOTE — Progress Notes (Signed)
D: Patient is aware of  Discharge this shift   A: Patient denies suicidal /homicidal ideations. Patient received all belongings brought in . No Storage medications. Writer reviewed Discharge Summary  Transitional Record and Suicide Risk  Assessment  7 day supply of medication given to patient  Aware  Of follow up appointment .  R: Patient left unit with no questions  Or concerns  With his own car.

## 2019-07-31 ENCOUNTER — Emergency Department (HOSPITAL_COMMUNITY)
Admission: EM | Admit: 2019-07-31 | Discharge: 2019-07-31 | Disposition: A | Discharge status: Home / Self Care | Payer: No Typology Code available for payment source | Attending: Emergency Medicine | Admitting: Emergency Medicine

## 2019-07-31 ENCOUNTER — Emergency Department (HOSPITAL_COMMUNITY): Payer: No Typology Code available for payment source

## 2019-07-31 ENCOUNTER — Encounter (HOSPITAL_COMMUNITY): Payer: Self-pay

## 2019-07-31 DIAGNOSIS — Y92410 Unspecified street and highway as the place of occurrence of the external cause: Secondary | ICD-10-CM | POA: Insufficient documentation

## 2019-07-31 DIAGNOSIS — R109 Unspecified abdominal pain: Secondary | ICD-10-CM

## 2019-07-31 DIAGNOSIS — R1032 Left lower quadrant pain: Secondary | ICD-10-CM | POA: Diagnosis not present

## 2019-07-31 DIAGNOSIS — Y999 Unspecified external cause status: Secondary | ICD-10-CM | POA: Insufficient documentation

## 2019-07-31 DIAGNOSIS — Y93I9 Activity, other involving external motion: Secondary | ICD-10-CM | POA: Diagnosis not present

## 2019-07-31 DIAGNOSIS — F17210 Nicotine dependence, cigarettes, uncomplicated: Secondary | ICD-10-CM | POA: Insufficient documentation

## 2019-07-31 DIAGNOSIS — S0990XA Unspecified injury of head, initial encounter: Secondary | ICD-10-CM | POA: Diagnosis present

## 2019-07-31 DIAGNOSIS — T1490XA Injury, unspecified, initial encounter: Secondary | ICD-10-CM

## 2019-07-31 LAB — I-STAT CHEM 8, ED
BUN: 10 mg/dL (ref 6–20)
BUN: 10 mg/dL (ref 6–20)
Calcium, Ion: 1.12 mmol/L — ABNORMAL LOW (ref 1.15–1.40)
Calcium, Ion: 1.13 mmol/L — ABNORMAL LOW (ref 1.15–1.40)
Chloride: 106 mmol/L (ref 98–111)
Chloride: 106 mmol/L (ref 98–111)
Creatinine, Ser: 1 mg/dL (ref 0.61–1.24)
Creatinine, Ser: 1 mg/dL (ref 0.61–1.24)
Glucose, Bld: 94 mg/dL (ref 70–99)
Glucose, Bld: 97 mg/dL (ref 70–99)
HCT: 39 % (ref 39.0–52.0)
HCT: 39 % (ref 39.0–52.0)
Hemoglobin: 13.3 g/dL (ref 13.0–17.0)
Hemoglobin: 13.3 g/dL (ref 13.0–17.0)
Potassium: 3.6 mmol/L (ref 3.5–5.1)
Potassium: 3.6 mmol/L (ref 3.5–5.1)
Sodium: 142 mmol/L (ref 135–145)
Sodium: 143 mmol/L (ref 135–145)
TCO2: 23 mmol/L (ref 22–32)
TCO2: 24 mmol/L (ref 22–32)

## 2019-07-31 LAB — PROTIME-INR
INR: 1 (ref 0.8–1.2)
Prothrombin Time: 12.4 seconds (ref 11.4–15.2)

## 2019-07-31 LAB — CBC
HCT: 39.9 % (ref 39.0–52.0)
Hemoglobin: 13.3 g/dL (ref 13.0–17.0)
MCH: 31.1 pg (ref 26.0–34.0)
MCHC: 33.3 g/dL (ref 30.0–36.0)
MCV: 93.4 fL (ref 80.0–100.0)
Platelets: 203 10*3/uL (ref 150–400)
RBC: 4.27 MIL/uL (ref 4.22–5.81)
RDW: 12.8 % (ref 11.5–15.5)
WBC: 8 10*3/uL (ref 4.0–10.5)
nRBC: 0 % (ref 0.0–0.2)

## 2019-07-31 LAB — COMPREHENSIVE METABOLIC PANEL
ALT: 45 U/L — ABNORMAL HIGH (ref 0–44)
AST: 43 U/L — ABNORMAL HIGH (ref 15–41)
Albumin: 3.6 g/dL (ref 3.5–5.0)
Alkaline Phosphatase: 85 U/L (ref 38–126)
Anion gap: 11 (ref 5–15)
BUN: 9 mg/dL (ref 6–20)
CO2: 21 mmol/L — ABNORMAL LOW (ref 22–32)
Calcium: 8.2 mg/dL — ABNORMAL LOW (ref 8.9–10.3)
Chloride: 107 mmol/L (ref 98–111)
Creatinine, Ser: 0.92 mg/dL (ref 0.61–1.24)
GFR calc Af Amer: >60 mL/min (ref >60)
GFR calc non Af Amer: >60 mL/min (ref >60)
Glucose, Bld: 104 mg/dL — ABNORMAL HIGH (ref 70–99)
Potassium: 3.6 mmol/L (ref 3.5–5.1)
Sodium: 139 mmol/L (ref 135–145)
Total Bilirubin: 0.6 mg/dL (ref 0.3–1.2)
Total Protein: 6.4 g/dL — ABNORMAL LOW (ref 6.5–8.1)

## 2019-07-31 LAB — LACTIC ACID, PLASMA: Lactic Acid, Venous: 2 mmol/L (ref 0.5–1.9)

## 2019-07-31 LAB — ETHANOL: Alcohol, Ethyl (B): 86 mg/dL — ABNORMAL HIGH (ref <10)

## 2019-07-31 MED ORDER — IOHEXOL 300 MG/ML  SOLN
100.0000 mL | Freq: Once | INTRAMUSCULAR | Status: AC | PRN
  Administered 2019-07-31: 100 mL via INTRAVENOUS

## 2019-07-31 MED ORDER — ONDANSETRON HCL 4 MG/2ML IJ SOLN
4.0000 mg | Freq: Once | INTRAMUSCULAR | Status: AC
  Administered 2019-07-31: 4 mg via INTRAVENOUS
  Filled 2019-07-31: qty 2

## 2019-07-31 MED ORDER — NAPROXEN 500 MG PO TABS
500.0000 mg | ORAL_TABLET | Freq: Two times a day (BID) | ORAL | 0 refills | Status: DC
Start: 2019-07-31 — End: 2020-02-18

## 2019-07-31 MED ORDER — OXYCODONE-ACETAMINOPHEN 5-325 MG PO TABS: 1.0000 | ORAL_TABLET | Freq: Four times a day (QID) | ORAL | 0 refills | Status: AC | PRN

## 2019-07-31 MED ORDER — MORPHINE SULFATE (PF) 4 MG/ML IV SOLN
4.0000 mg | Freq: Once | INTRAVENOUS | Status: AC
  Administered 2019-07-31: 4 mg via INTRAVENOUS
  Filled 2019-07-31: qty 1

## 2019-07-31 MED ORDER — OXYCODONE-ACETAMINOPHEN 5-325 MG PO TABS
1.0000 | ORAL_TABLET | Freq: Once | ORAL | Status: AC
  Administered 2019-07-31: 1 via ORAL
  Filled 2019-07-31: qty 1

## 2019-07-31 MED FILL — OXYCODONE-APAP 5-325MG: 5-325 | 3 days supply | Qty: 10 | Fill #0
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
## 2019-07-31 NOTE — ED Notes (Signed)
Pt was able to drink and walk to bathroom and get dressed.

## 2019-07-31 NOTE — ED Notes (Signed)
Patient verbalizes understanding of discharge instructions. Opportunity for questioning and answers were provided. Armband removed by staff, pt discharged from ED. Ambulated out to lobby  
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
## 2019-10-02 ENCOUNTER — Encounter (HOSPITAL_COMMUNITY): Payer: Self-pay | Admitting: Psychiatry

## 2019-10-02 ENCOUNTER — Other Ambulatory Visit: Payer: Self-pay

## 2019-10-02 ENCOUNTER — Telehealth (INDEPENDENT_AMBULATORY_CARE_PROVIDER_SITE_OTHER): Payer: No Payment, Other | Admitting: Psychiatry

## 2019-10-02 DIAGNOSIS — F332 Major depressive disorder, recurrent severe without psychotic features: Secondary | ICD-10-CM

## 2019-10-02 DIAGNOSIS — F411 Generalized anxiety disorder: Secondary | ICD-10-CM

## 2019-10-02 MED ORDER — HYDROXYZINE HCL 10 MG PO TABS: 10.0000 mg | ORAL_TABLET | Freq: Three times a day (TID) | ORAL | 2 refills | Status: DC | PRN

## 2019-10-02 MED ORDER — BENZTROPINE MESYLATE 0.5 MG PO TABS: 0.5000 mg | ORAL_TABLET | Freq: Every day | ORAL | 2 refills | Status: DC

## 2019-10-02 MED ORDER — PAROXETINE HCL 40 MG PO TABS: 40.0000 mg | ORAL_TABLET | Freq: Every day | ORAL | 2 refills | Status: DC

## 2019-10-02 MED ORDER — ARIPIPRAZOLE 2 MG PO TABS: 2.0000 mg | ORAL_TABLET | Freq: Every day | ORAL | 2 refills | Status: DC

## 2019-10-02 MED ORDER — TRAZODONE HCL 50 MG PO TABS: 50.0000 mg | ORAL_TABLET | Freq: Every day | ORAL | 2 refills | Status: DC

## 2019-10-02 MED FILL — ARIPiprazole 2 MG TABS: 2 | 30 days supply | Qty: 30 | Fill #0

## 2019-10-02 MED FILL — BENZTROPINE MES 0.5 MG TAB: 0.5 | 30 days supply | Qty: 30 | Fill #0

## 2019-10-02 MED FILL — hydrOXYzine HCL 10 MG TABS: 10 | 30 days supply | Qty: 90 | Fill #0

## 2019-10-02 MED FILL — PARoxetine HCL 40 MG TABS: 40 | 30 days supply | Qty: 30 | Fill #0

## 2019-10-02 MED FILL — traZODone HCL 50 MG TABS: 50 | 30 days supply | Qty: 30 | Fill #0
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
## 2019-10-12 ENCOUNTER — Ambulatory Visit: Payer: Self-pay

## 2019-12-19 ENCOUNTER — Telehealth (HOSPITAL_COMMUNITY): Payer: No Payment, Other | Admitting: Psychiatry

## 2019-12-21 ENCOUNTER — Telehealth (HOSPITAL_COMMUNITY): Payer: No Payment, Other | Admitting: Psychiatry

## 2019-12-21 ENCOUNTER — Other Ambulatory Visit: Payer: Self-pay

## 2019-12-31 ENCOUNTER — Telehealth (HOSPITAL_COMMUNITY): Payer: No Payment, Other | Admitting: Psychiatry

## 2020-01-02 ENCOUNTER — Telehealth (HOSPITAL_COMMUNITY): Payer: Self-pay | Admitting: Psychiatry

## 2020-01-03 NOTE — Telephone Encounter (Signed)
Provider attempted to call patient on 01/02/2020 and on 01/02/09/2021 without success.

## 2020-01-09 ENCOUNTER — Telehealth (HOSPITAL_COMMUNITY): Payer: No Payment, Other | Admitting: Psychiatry

## 2020-01-09 ENCOUNTER — Other Ambulatory Visit: Payer: Self-pay

## 2020-01-10 ENCOUNTER — Emergency Department
Admission: EM | Admit: 2020-01-10 | Discharge: 2020-01-11 | Disposition: A | Discharge status: Home / Self Care | Payer: Self-pay | Attending: Emergency Medicine | Admitting: Emergency Medicine

## 2020-01-10 ENCOUNTER — Emergency Department: Payer: Self-pay

## 2020-01-10 ENCOUNTER — Other Ambulatory Visit: Payer: Self-pay

## 2020-01-10 ENCOUNTER — Encounter: Payer: Self-pay | Admitting: Emergency Medicine

## 2020-01-10 DIAGNOSIS — J45909 Unspecified asthma, uncomplicated: Secondary | ICD-10-CM | POA: Insufficient documentation

## 2020-01-10 DIAGNOSIS — E8729 Other acidosis: Secondary | ICD-10-CM

## 2020-01-10 DIAGNOSIS — F17210 Nicotine dependence, cigarettes, uncomplicated: Secondary | ICD-10-CM | POA: Insufficient documentation

## 2020-01-10 DIAGNOSIS — Z20822 Contact with and (suspected) exposure to covid-19: Secondary | ICD-10-CM | POA: Insufficient documentation

## 2020-01-10 DIAGNOSIS — R000 Tachycardia, unspecified: Secondary | ICD-10-CM | POA: Insufficient documentation

## 2020-01-10 DIAGNOSIS — E872 Acidosis: Secondary | ICD-10-CM | POA: Insufficient documentation

## 2020-01-10 LAB — CBC
HCT: 46.4 % (ref 39.0–52.0)
Hemoglobin: 17 g/dL (ref 13.0–17.0)
MCH: 30.3 pg (ref 26.0–34.0)
MCHC: 36.6 g/dL — ABNORMAL HIGH (ref 30.0–36.0)
MCV: 82.7 fL (ref 80.0–100.0)
Platelets: 300 10*3/uL (ref 150–400)
RBC: 5.61 MIL/uL (ref 4.22–5.81)
RDW: 12.1 % (ref 11.5–15.5)
WBC: 17.7 10*3/uL — ABNORMAL HIGH (ref 4.0–10.5)
nRBC: 0 % (ref 0.0–0.2)

## 2020-01-10 LAB — BASIC METABOLIC PANEL
Anion gap: 19 — ABNORMAL HIGH (ref 5–15)
BUN: 12 mg/dL (ref 6–20)
CO2: 26 mmol/L (ref 22–32)
Calcium: 8.7 mg/dL — ABNORMAL LOW (ref 8.9–10.3)
Chloride: 86 mmol/L — ABNORMAL LOW (ref 98–111)
Creatinine, Ser: 1.08 mg/dL (ref 0.61–1.24)
GFR, Estimated: >60 mL/min (ref >60)
Glucose, Bld: 140 mg/dL — ABNORMAL HIGH (ref 70–99)
Potassium: 2.4 mmol/L — CL (ref 3.5–5.1)
Sodium: 131 mmol/L — ABNORMAL LOW (ref 135–145)

## 2020-01-10 LAB — TROPONIN I (HIGH SENSITIVITY)
Troponin I (High Sensitivity): 9 ng/L (ref <18)
Troponin I (High Sensitivity): 11 ng/L (ref <18)

## 2020-01-10 MED ORDER — LORAZEPAM 1 MG PO TABS: 1.0000 mg | ORAL_TABLET | Freq: Once | ORAL | Status: DC

## 2020-01-10 MED ORDER — POTASSIUM CHLORIDE CRYS ER 20 MEQ PO TBCR
40.0000 meq | EXTENDED_RELEASE_TABLET | Freq: Once | ORAL | Status: AC
  Administered 2020-01-11: 40 meq via ORAL
  Filled 2020-01-10: qty 2

## 2020-01-10 MED ORDER — IOHEXOL 350 MG/ML SOLN
100.0000 mL | Freq: Once | INTRAVENOUS | Status: AC | PRN
  Administered 2020-01-10: 100 mL via INTRAVENOUS

## 2020-01-10 MED ORDER — LORAZEPAM 2 MG/ML IJ SOLN
1.0000 mg | Freq: Once | INTRAMUSCULAR | Status: AC
  Administered 2020-01-10: 1 mg via INTRAVENOUS
  Filled 2020-01-10: qty 1

## 2020-01-10 MED ORDER — POTASSIUM CHLORIDE IN NACL 20-0.9 MEQ/L-% IV SOLN
Freq: Once | INTRAVENOUS | Status: AC
  Filled 2020-01-10: qty 1000

## 2020-01-10 MED ORDER — POTASSIUM CHLORIDE CRYS ER 20 MEQ PO TBCR
40.0000 meq | EXTENDED_RELEASE_TABLET | Freq: Once | ORAL | Status: AC
  Administered 2020-01-10: 40 meq via ORAL
  Filled 2020-01-10: qty 2
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
## 2020-01-10 NOTE — ED Triage Notes (Signed)
Pt via pov from home with chest pain and sob that began around 10 am. Pt states his mother took his blood pressure and insisted on bringing him to the hospital. Pt denies diaphoresis, endorses nausea. Pt alert & oriented, NAD noted.

## 2020-01-10 NOTE — ED Notes (Signed)
PT given crackers, soda, and warm blanket. Apologized about wait time to pt.

## 2020-01-10 NOTE — ED Notes (Signed)
Date and time results received: 01/10/20 1517 (use smartphrase ".now" to insert current time)  Test: K Critical Value: 2.4  Name of Provider Notified: Delton Prairie  Orders Received? Or Actions Taken?: Orders Received - See Orders for details

## 2020-01-10 NOTE — ED Notes (Signed)
Patient transported to CT with technician on stretcher.  

## 2020-01-11 ENCOUNTER — Other Ambulatory Visit (HOSPITAL_COMMUNITY): Payer: Self-pay | Admitting: Psychiatry

## 2020-01-11 DIAGNOSIS — F332 Major depressive disorder, recurrent severe without psychotic features: Secondary | ICD-10-CM

## 2020-01-11 DIAGNOSIS — F411 Generalized anxiety disorder: Secondary | ICD-10-CM

## 2020-01-11 LAB — BASIC METABOLIC PANEL
Anion gap: 9 (ref 5–15)
BUN: 10 mg/dL (ref 6–20)
CO2: 29 mmol/L (ref 22–32)
Calcium: 7.8 mg/dL — ABNORMAL LOW (ref 8.9–10.3)
Chloride: 96 mmol/L — ABNORMAL LOW (ref 98–111)
Creatinine, Ser: 0.94 mg/dL (ref 0.61–1.24)
GFR, Estimated: >60 mL/min (ref >60)
Glucose, Bld: 109 mg/dL — ABNORMAL HIGH (ref 70–99)
Potassium: 3.1 mmol/L — ABNORMAL LOW (ref 3.5–5.1)
Sodium: 134 mmol/L — ABNORMAL LOW (ref 135–145)

## 2020-01-11 LAB — URINE DRUG SCREEN, QUALITATIVE (ARMC ONLY)
Amphetamines, Ur Screen: NOT DETECTED
Barbiturates, Ur Screen: NOT DETECTED
Benzodiazepine, Ur Scrn: NOT DETECTED
Cannabinoid 50 Ng, Ur ~~LOC~~: POSITIVE — AB
Cocaine Metabolite,Ur ~~LOC~~: NOT DETECTED
MDMA (Ecstasy)Ur Screen: NOT DETECTED
Methadone Scn, Ur: NOT DETECTED
Opiate, Ur Screen: NOT DETECTED
Phencyclidine (PCP) Ur S: NOT DETECTED
Tricyclic, Ur Screen: NOT DETECTED

## 2020-01-11 LAB — RESP PANEL BY RT-PCR (FLU A&B, COVID) ARPGX2
Influenza A by PCR: NEGATIVE
Influenza B by PCR: NEGATIVE
SARS Coronavirus 2 by RT PCR: NEGATIVE

## 2020-01-11 MED ORDER — POTASSIUM CHLORIDE CRYS ER 15 MEQ PO TBCR: 15.0000 meq | EXTENDED_RELEASE_TABLET | Freq: Two times a day (BID) | ORAL | 0 refills | Status: AC

## 2020-01-11 MED ORDER — CHLORDIAZEPOXIDE HCL 25 MG PO CAPS
25.0000 mg | ORAL_CAPSULE | Freq: Once | ORAL | Status: AC
  Administered 2020-01-11: 25 mg via ORAL
  Filled 2020-01-11: qty 1

## 2020-01-11 MED ORDER — CHLORDIAZEPOXIDE HCL 10 MG PO CAPS: ORAL_CAPSULE | ORAL | 0 refills | Status: DC

## 2020-01-11 MED ORDER — SODIUM CHLORIDE 0.9 % IV BOLUS
1000.0000 mL | Freq: Once | INTRAVENOUS | Status: AC
  Administered 2020-01-11: 1000 mL via INTRAVENOUS

## 2020-01-11 MED ORDER — LORAZEPAM 1 MG PO TABS
1.0000 mg | ORAL_TABLET | Freq: Once | ORAL | Status: AC
  Administered 2020-01-11: 1 mg via ORAL
  Filled 2020-01-11: qty 1

## 2020-01-11 NOTE — Discharge Instructions (Addendum)
Please drink plenty of fluids over the next several days.  Take your potassium supplement twice daily for the next 7 days.  You have been prescribed a medication called Librium.  This medication helps with alcohol withdrawal symptoms.  Please do not drink alcohol while taking this medication.  If you do drink alcohol please discontinue this medication.  Please call the number provided for [facility] to arrange a follow-up to discuss alcohol use and different treatments.  Return to the emergency department for any significant nausea vomiting, abdominal pain, confusion, or any other symptom personally concerning to yourself.

## 2020-02-18 ENCOUNTER — Encounter (HOSPITAL_COMMUNITY): Payer: Self-pay | Admitting: Psychiatry

## 2020-02-18 ENCOUNTER — Other Ambulatory Visit: Payer: Self-pay

## 2020-02-18 ENCOUNTER — Telehealth (INDEPENDENT_AMBULATORY_CARE_PROVIDER_SITE_OTHER): Payer: No Payment, Other | Admitting: Psychiatry

## 2020-02-18 DIAGNOSIS — F332 Major depressive disorder, recurrent severe without psychotic features: Secondary | ICD-10-CM

## 2020-02-18 DIAGNOSIS — F411 Generalized anxiety disorder: Secondary | ICD-10-CM | POA: Diagnosis not present

## 2020-02-18 MED ORDER — PAROXETINE HCL 40 MG PO TABS: 40.0000 mg | ORAL_TABLET | Freq: Every day | ORAL | 2 refills | Status: DC

## 2020-02-18 MED ORDER — ARIPIPRAZOLE 2 MG PO TABS: 2.0000 mg | ORAL_TABLET | Freq: Every day | ORAL | 2 refills | Status: DC

## 2020-02-18 MED ORDER — BENZTROPINE MESYLATE 0.5 MG PO TABS: 0.5000 mg | ORAL_TABLET | Freq: Every day | ORAL | 2 refills | Status: DC

## 2020-02-18 MED ORDER — TRAZODONE HCL 100 MG PO TABS: 50.0000 mg | ORAL_TABLET | Freq: Every day | ORAL | 2 refills | Status: DC

## 2020-02-18 NOTE — Progress Notes (Signed)
BH MD/PA/NP OP Progress Note Virtual Visit via Telephone Note  I connected with [name] on 02/18/20 at 10:00 AM EST by telephone and verified that I am speaking with the correct person using two identifiers.  Location: Patient: home Provider: Clinic   I discussed the limitations, risks, security and privacy concerns of performing an evaluation and management service by telephone and the availability of in person appointments. I also discussed with the patient that there may be a patient responsible charge related to this service. The patient expressed understanding and agreed to proceed.   I provided 30 minutes of non-face-to-face time during this encounter.   02/18/2020 9:14 AM [name]  MRN:  [id number]  Chief Complaint: "The trazodone 50 mg is not enough"  HPI:   35 year old male seen today for follow up psychiatric evaluation.  He has a psychiatric history of alcohol use disorder, opioid use disorder, depression, anxiety, bipolar disorder, SI, and cocaine use disorder (patient reports that he never used cocaine and reports that it showed up in his system as a false positive). He is currently managed on Paxil 40 mg, Abilify 2 mg daily, Trazodone 50 nightly Prn, and hydroxyzine 10 mg three times daily as needed. He informed provider that hydroxyzine only sedated him and did not help manage his anxiety. He notes his other medications are somewhat effective in managing his psychiatric conditions.   Today patient was unable to login virtually so his exam was done over the phone. He informed provider that he has been more anxious recently due to life stressors. He notes that he and his ex wife are going through a custody battle. He notes they go to court in February and reports that he is unable to afford legal council. He also informed Clinical research associate that he broke up with his girlfriend around Thanksgiving. He notes that they got into a dispute and he now has to attend anger  management courses. Today provider conducted a GAD 7 and patient scored a 10. Patient notes that his depression has improved since his last visit. Provider conducted a PHQ 9 and patient scored a 6. Today he denies SI/HI/VAH, mania, or paranoia. He notes that his sleep is poor and notes that 50 mg of Trazodone is not effective. He endorses having a good appetite.   Today he is agreeable to increase Trazodone 50 mg nightly PRN to 100 mg nightly Prn to help manage sleep. He is also agreeable to start therapy to help manage symptoms of anxiety. He notes that he dislikes hydroxyzine and is agreeable to discontinue it. He will continue all other medications as prescribed.     Visit Diagnosis:    ICD-10-CM   1. Severe recurrent major depression without psychotic features (HCC)  F33.2 ARIPiprazole (ABILIFY) 2 MG tablet    benztropine (COGENTIN) 0.5 MG tablet    PARoxetine (PAXIL) 40 MG tablet    traZODone (DESYREL) 100 MG tablet    Ambulatory referral to Social Work  2. Generalized anxiety disorder  F41.1 PARoxetine (PAXIL) 40 MG tablet    Ambulatory referral to Social Work    Past Psychiatric History: alcohol use disorder, opioid use disorder, depression, anxiety, bipolar disorder, SI, and cocaine use disorder (patient reports that he never used cocaine and reports that it showed up in his system as a false positive).  Past Medical History:  Past Medical History:  Diagnosis Date  . Anxiety   . Asthma    as a child  . Bipolar  disorder [facility](HCC)    "supposed to take Latuda, but it is too expensive"  . Closed head injury with concussion   . Complication of anesthesia    "I woke up from surgery in April (23, 2019) very mean"  . Depression   . History of kidney stones     passed x2 lithotripsy    Past Surgical History:  Procedure Laterality Date  . AMPUTATION Right 05/24/2017   Procedure: LITTLE FINGER REPAIR EXTENSOR AND FLEXOR TENDON, LATERAL LIGAMENT REPAIR, NERVE REPAIR, JOINT PINNING;   Surgeon: Dominica SeverinGramig, William, MD;  Location: [facility] OR;  Service: Orthopedics;  Laterality: Right;  . CHOLECYSTECTOMY    . CYSTOSCOPY W/ STONE MANIPULATION    . FOOT SURGERY Right    fracture  . HARDWARE REMOVAL Right 07/12/2017   Procedure: Right small finger hardware removal, pin removal;  Surgeon: Dominica SeverinGramig, William, MD;  Location: [facility] OR;  Service: Orthopedics;  Laterality: Right;  45 mins  . LITHOTRIPSY      Family Psychiatric History:  Mother bipolar and depression. Maternal aunts depression, and maternal grandmother depression  Family History:  Family History  Problem Relation Age of Onset  . Depression Mother   . Alcohol abuse Maternal Uncle   . Depression Maternal Uncle     Social History:  Social History   Socioeconomic History  . Marital status: Divorced    Spouse name: Not on file  . Number of children: Not on file  . Years of education: Not on file  . Highest education level: Not on file  Occupational History  . Not on file  Tobacco Use  . Smoking status: Current Every Day Smoker    Packs/day: 1.00    Years: 14.00    Pack years: 14.00    Types: Cigarettes  . Smokeless tobacco: Never Used  . Tobacco comment: Reports has cut back to 1 pk a day, has Chantix but has not started.   Vaping Use  . Vaping Use: Never used  Substance and Sexual Activity  . Alcohol use: Yes    Alcohol/week: 21.0 standard drinks    Types: 21 Cans of beer per week    Comment: 2-3 per day  . Drug use: Not Currently    Frequency: 7.0 times per week    Types: Marijuana  . Sexual activity: Yes    Partners: Female    Birth control/protection: None  Other Topics Concern  . Not on file  Social History Narrative  . Not on file   Social Determinants of Health   Financial Resource Strain: Not on file  Food Insecurity: Not on file  Transportation Needs: Not on file  Physical Activity: Not on file  Stress: Not on file  Social Connections: Not on file    Allergies: No Known  Allergies  Metabolic Disorder Labs: No results found for: HGBA1C, MPG No results found for: PROLACTIN No results found for: CHOL, TRIG, HDL, CHOLHDL, VLDL, LDLCALC No results found for: TSH  Therapeutic Level Labs: No results found for: LITHIUM No results found for: VALPROATE No components found for:  CBMZ  Current Medications: Current Outpatient Medications  Medication Sig Dispense Refill  . ARIPiprazole (ABILIFY) 2 MG tablet Take 1 tablet (2 mg total) by mouth daily. 30 tablet 2  . benztropine (COGENTIN) 0.5 MG tablet Take 1 tablet (0.5 mg total) by mouth daily. 30 tablet 2  . chlordiazePOXIDE (LIBRIUM) 10 MG capsule Day 1-2: Take 1 tablet p.o. every 8 hours. Day 3-4: Take 1 tablet p.o. every 12  hours. Day 5-6: Take 1 tablet p.o. daily. Day 7: Stop 30 capsule 0  . hydrOXYzine (ATARAX/VISTARIL) 10 MG tablet Take 1 tablet (10 mg total) by mouth 3 (three) times daily as needed. 90 tablet 2  . naltrexone (DEPADE) 50 MG tablet Take 1 tablet (50 mg total) by mouth daily. (Patient not taking: Reported on 07/31/2019) 30 tablet 2  . oxyCODONE-acetaminophen (PERCOCET/ROXICET) 5-325 MG tablet Take 1 tablet by mouth every 6 (six) hours as needed for severe pain. 10 tablet 0  . PARoxetine (PAXIL) 40 MG tablet Take 1 tablet (40 mg total) by mouth daily. 30 tablet 2  . potassium chloride SA (KLOR-CON M15) 15 MEQ tablet Take 1 tablet (15 mEq total) by mouth 2 (two) times daily. 14 tablet 0  . traZODone (DESYREL) 100 MG tablet Take 0.5 tablets (50 mg total) by mouth at bedtime. 30 tablet 2   No current facility-administered medications for this visit.     Musculoskeletal: Strength & Muscle Tone: Unable to assess due to telehealth visit Gait & Station: Unable to assess due to telehealth visit Patient leans: N/A  Psychiatric Specialty Exam: Review of Systems  There were no vitals taken for this visit.There is no height or weight on file to calculate BMI.  General Appearance: Unable to assess  due to telehealth visit  Eye Contact:  Unable to assess due to telehealth visit  Speech:  Clear and Coherent and Normal Rate  Volume:  Normal  Mood:  Anxious and Depressed  Affect:  Appropriate and Congruent  Thought Process:  Coherent, Goal Directed and Linear  Orientation:  Full (Time, Place, and Person)  Thought Content: WDL and Logical   Suicidal Thoughts:  No  Homicidal Thoughts:  No  Memory:  Immediate;   Good Recent;   Good Remote;   Good  Judgement:  Good  Insight:  Good  Psychomotor Activity:  Normal  Concentration:  Concentration: Good and Attention Span: Good  Recall:  NA  Fund of Knowledge: Good  Language: Good  Akathisia:  No  Handed:  Ambidextrous  AIMS (if indicated):Not done  Assets:  Communication Skills Desire for Improvement Financial Resources/Insurance Housing Social Support  ADL's:  Intact  Cognition: WNL  Sleep:  Fair   Screenings: AIMS   Flowsheet Row Admission (Discharged) from 03/25/2015 in [facility]PAT[facility]AL MEDICINE  AIMS Total Score 0    AUDIT   Flowsheet Row Admission (Discharged) from 08/15/2018 in [facility]INE Admission (Discharged) from 03/25/2015 in [facility] INPATIENT BEHAVIORAL MEDICINE  Alcohol Use Disorder Identification Test Final Score (AUDIT) 19 30    GAD-7   Flowsheet Row Video Visit from 02/18/2020 in [facility]  Total GAD-7 Score 10    PHQ2-9   Flowsheet Row Video Visit from 02/18/2020 in [facility]  PHQ-2 Total Score 2  PHQ-9 Total Score 6       Assessment and Plan: Patient notes that he has poor sleep and anxiety surrounding life stressors. He is agreeable to increase Trazodone 50 mg nightly PRN to 100 mg nightly Prn to help manage sleep. He is also agreeable to start therapy to help manage symptoms of anxiety. He notes that he dislikes hydroxyzine and is agreeable to discontinue it. He will continue all other medications as prescribed.    1. Severe recurrent major depression without psychotic features (HCC)  Continue- ARIPiprazole (ABILIFY) 2 MG tablet; Take 1 tablet (2 mg total) by mouth daily.  Dispense: 30 tablet; Refill: 2 -Continue  benztropine (COGENTIN) 0.5 MG tablet; Take 1 tablet (0.5 mg total) by mouth daily.  Dispense: 30 tablet; Refill: 2 Continue- PARoxetine (PAXIL) 40 MG tablet; Take 1 tablet (40 mg total) by mouth daily.  Dispense: 30 tablet; Refill: 2 Increased- traZODone (DESYREL) 100 MG tablet; Take 0.5 tablets (50 mg total) by mouth at bedtime.  Dispense: 30 tablet; Refill: 2 - Ambulatory referral to Social Work  2. Generalized anxiety disorder  Continue- PARoxetine (PAXIL) 40 MG tablet; Take 1 tablet (40 mg total) by mouth daily.  Dispense: 30 tablet; Refill: 2 - Ambulatory referral to Social Work  Follow up in 3 months Follow up with therapy  Shanna Cisco, NP 02/18/2020, 9:14 AM

## 2020-02-19 ENCOUNTER — Telehealth (HOSPITAL_COMMUNITY): Payer: Self-pay | Admitting: General Practice

## 2020-02-19 NOTE — Telephone Encounter (Signed)
Patient requested assistance locating an anger management course due to a court requirement.   Writer provided patient with the following list of resources:   [email or web address] AMI Training The cost of the [employer]'s online anger management programs depends on which class is selected. Our 4 hour/session class costs $55.00; Our 8 hour/session class costs $85.00; our 12 hour/session anger management class costs $90.00. The cost of the 24-hour class is $100.00. There are no hidden charges whatsoever. None of the classes increase in cost as you buy more hours or sessions and the prices are clearly displayed in the payment section when you register.   [facility], [facility] Licensed Clinical Mental Health Counselor, PhD, [facility], [facility], [place], [place] [phone]   [facility]  Call us: [phone] Visit [place]: [address], [place], [place] [address]

## 2020-04-11 ENCOUNTER — Ambulatory Visit (HOSPITAL_COMMUNITY): Payer: 59 | Admitting: Licensed Clinical Social Worker

## 2020-04-11 ENCOUNTER — Telehealth (HOSPITAL_COMMUNITY): Payer: Self-pay | Admitting: Licensed Clinical Social Worker

## 2020-04-11 ENCOUNTER — Other Ambulatory Visit: Payer: Self-pay

## 2020-04-11 NOTE — Telephone Encounter (Signed)
LCSW spoke with pt for appointment today. He stated he did his court order anger management therapy and no longer needs therapy at this time. Pt is also uninsured living in [facility] [place] [place] if he wants therapy pt needs to be seen by an [place] provider [employer] information provided

## 2020-05-19 ENCOUNTER — Telehealth (INDEPENDENT_AMBULATORY_CARE_PROVIDER_SITE_OTHER): Payer: No Payment, Other | Admitting: Psychiatry

## 2020-05-19 ENCOUNTER — Other Ambulatory Visit: Payer: Self-pay

## 2020-05-19 ENCOUNTER — Encounter (HOSPITAL_COMMUNITY): Payer: Self-pay | Admitting: Psychiatry

## 2020-05-19 DIAGNOSIS — F411 Generalized anxiety disorder: Secondary | ICD-10-CM

## 2020-05-19 DIAGNOSIS — F900 Attention-deficit hyperactivity disorder, predominantly inattentive type: Secondary | ICD-10-CM | POA: Diagnosis not present

## 2020-05-19 DIAGNOSIS — F332 Major depressive disorder, recurrent severe without psychotic features: Secondary | ICD-10-CM

## 2020-05-19 MED ORDER — GUANFACINE HCL ER 1 MG PO TB24: 1.0000 mg | ORAL_TABLET | Freq: Every day | ORAL | 2 refills | Status: DC

## 2020-05-19 MED ORDER — PAROXETINE HCL 40 MG PO TABS: 40.0000 mg | ORAL_TABLET | Freq: Every day | ORAL | 2 refills | Status: DC

## 2020-05-19 MED ORDER — ARIPIPRAZOLE 2 MG PO TABS: 2.0000 mg | ORAL_TABLET | Freq: Every day | ORAL | 2 refills | Status: DC

## 2020-05-19 MED ORDER — TRAZODONE HCL 100 MG PO TABS: 50.0000 mg | ORAL_TABLET | Freq: Every day | ORAL | 2 refills | Status: DC

## 2020-05-19 NOTE — Progress Notes (Signed)
BH MD/PA/NP OP Progress Note Virtual Visit via Video Note  I connected with [name] on 05/19/20 at 10:00 AM EDT by a video enabled telemedicine application and verified that I am speaking with the correct person using two identifiers.  Location: Patient: Home Provider: Clinic   I discussed the limitations of evaluation and management by telemedicine and the availability of in person appointments. The patient expressed understanding and agreed to proceed.  I provided 30 minutes of non-face-to-face time during this encounter.     05/19/2020 10:21 AM [name]  MRN:  [id number]  Chief Complaint: "I have trouble focusing at work.  I may have ADHD."  HPI:   35 year old male seen today for follow up psychiatric evaluation.  He has a psychiatric history of alcohol use disorder, opioid use disorder, depression, anxiety, bipolar disorder, SI, and cocaine use disorder (patient reports that he never used cocaine and reports that it showed up in his system as a false positive). He is currently managed on Paxil 40 mg, Abilify 2 mg daily, and Trazodone 100 nightly as needed. He notes his medications are somewhat effective in managing his psychiatric conditions.   Today patient was well-groomed, pleasant, cooperative, engaged in conversation.  He informed provider that since his last visit things have been going well.  He notes that he likes his medication regimen however reports that he is having difficulties focusing especially at work.  He endorses distractibility, forgetfulness, disorganization, poor listening skills, and avoidance of mentally taxing task.  He informed Clinical research associate that his anxiety and depression are well managed on his medications and reports that he does not want them adjusted but is interested in starting something for ADHD.  Today provider conducted a GAD-7 and patient scored a 19, at his last visit he scored a 10.  Provider also conducted a PHQ-9 and patient scored  an 11, at his last visit he scored a 6.  He denies SI/HI/VAH, mania, or paranoia.  He notes that his appetite and weight fluctuates.  Patient notes that since increasing trazodone he has been sleeping 8 hours nightly.    Patient informed Clinical research associate that he has several stressors that exacerbate his anxiety and depression.  He notes that his custody case for his daughter is still pending.  He also informed Clinical research associate that recently a civil case against his ex-girlfriend was dismissed.  He notes that he will pursue it again and sue her because she is holding onto all of his belongings.  Patient notes that he continues to be sober from illegal substances.  He notes that a few weeks ago he had alcohol but reports that since then he has been sober of all substances.  Today he is agreeable to starting Intuniv 1 mg nightly to help manage symptoms of ADHD.  He will continue all other medications as prescribed.  No other concerns noted at this time.    Visit Diagnosis:    ICD-10-CM   1. Attention deficit hyperactivity disorder (ADHD), predominantly inattentive type  F90.0 guanFACINE (INTUNIV) 1 MG TB24 ER tablet  2. Severe recurrent major depression without psychotic features (HCC)  F33.2 ARIPiprazole (ABILIFY) 2 MG tablet    traZODone (DESYREL) 100 MG tablet    PARoxetine (PAXIL) 40 MG tablet  3. Generalized anxiety disorder  F41.1 PARoxetine (PAXIL) 40 MG tablet    Past Psychiatric History: alcohol use disorder, opioid use disorder, depression, anxiety, bipolar disorder, SI, and cocaine use disorder (patient reports that he never used cocaine and  reports that it showed up in his system as a false positive).  Past Medical History:  Past Medical History:  Diagnosis Date  . Anxiety   . Asthma    as a child  . Bipolar disorder (HCC)    "supposed to take Latuda, but it is too expensive"  . Closed head injury with concussion   . Complication of anesthesia    "I woke up from surgery in April (23, 2019) very  mean"  . Depression   . History of kidney stones     passed x2 lithotripsy    Past Surgical History:  Procedure Laterality Date  . AMPUTATION Right 05/24/2017   Procedure: LITTLE FINGER REPAIR EXTENSOR AND FLEXOR TENDON, LATERAL LIGAMENT REPAIR, NERVE REPAIR, JOINT PINNING;  Surgeon: Dominica Severin, MD;  Location: [facility] OR;  Service: Orthopedics;  Laterality: Right;  . CHOLECYSTECTOMY    . CYSTOSCOPY W/ STONE MANIPULATION    . FOOT SURGERY Right    fracture  . HARDWARE REMOVAL Right 07/12/2017   Procedure: Right small finger hardware removal, pin removal;  Surgeon: Dominica Severin, MD;  Location: [facility] OR;  Service: Orthopedics;  Laterality: Right;  45 mins  . LITHOTRIPSY      Family Psychiatric History:  Mother bipolar and depression. Maternal aunts depression, and maternal grandmother depression  Family History:  Family History  Problem Relation Age of Onset  . Depression Mother   . Alcohol abuse Maternal Uncle   . Depression Maternal Uncle     Social History:  Social History   Socioeconomic History  . Marital status: Divorced    Spouse name: Not on file  . Number of children: Not on file  . Years of education: Not on file  . Highest education level: Not on file  Occupational History  . Not on file  Tobacco Use  . Smoking status: Current Every Day Smoker    Packs/day: 1.00    Years: 14.00    Pack years: 14.00    Types: Cigarettes  . Smokeless tobacco: Never Used  . Tobacco comment: Reports has cut back to 1 pk a day, has Chantix but has not started.   Vaping Use  . Vaping Use: Never used  Substance and Sexual Activity  . Alcohol use: Yes    Alcohol/week: 21.0 standard drinks    Types: 21 Cans of beer per week    Comment: 2-3 per day  . Drug use: Not Currently    Frequency: 7.0 times per week    Types: Marijuana  . Sexual activity: Yes    Partners: Female    Birth control/protection: None  Other Topics Concern  . Not on file  Social History Narrative  . Not on  file   Social Determinants of Health   Financial Resource Strain: Not on file  Food Insecurity: Not on file  Transportation Needs: Not on file  Physical Activity: Not on file  Stress: Not on file  Social Connections: Not on file    Allergies: No Known Allergies  Metabolic Disorder Labs: No results found for: HGBA1C, MPG No results found for: PROLACTIN No results found for: CHOL, TRIG, HDL, CHOLHDL, VLDL, LDLCALC No results found for: TSH  Therapeutic Level Labs: No results found for: LITHIUM No results found for: VALPROATE No components found for:  CBMZ  Current Medications: Current Outpatient Medications  Medication Sig Dispense Refill  . guanFACINE (INTUNIV) 1 MG TB24 ER tablet Take 1 tablet (1 mg total) by mouth daily. 30 tablet 2  .  ARIPiprazole (ABILIFY) 2 MG tablet Take 1 tablet (2 mg total) by mouth daily. 30 tablet 2  . oxyCODONE-acetaminophen (PERCOCET/ROXICET) 5-325 MG tablet Take 1 tablet by mouth every 6 (six) hours as needed for severe pain. 10 tablet 0  . PARoxetine (PAXIL) 40 MG tablet Take 1 tablet (40 mg total) by mouth daily. 30 tablet 2  . potassium chloride SA (KLOR-CON M15) 15 MEQ tablet Take 1 tablet (15 mEq total) by mouth 2 (two) times daily. 14 tablet 0  . traZODone (DESYREL) 100 MG tablet Take 0.5 tablets (50 mg total) by mouth at bedtime. 30 tablet 2   No current facility-administered medications for this visit.     Musculoskeletal: Strength & Muscle Tone: Unable to assess due to telehealth visit Gait & Station: Unable to assess due to telehealth visit Patient leans: N/A  Psychiatric Specialty Exam: Review of Systems  There were no vitals taken for this visit.There is no height or weight on file to calculate BMI.  General Appearance: Well Groomed  Eye Contact:  Good  Speech:  Clear and Coherent and Normal Rate  Volume:  Normal  Mood:  Anxious and Depressed  Affect:  Appropriate and Congruent  Thought Process:  Coherent, Goal Directed and  Linear  Orientation:  Full (Time, Place, and Person)  Thought Content: WDL and Logical   Suicidal Thoughts:  No  Homicidal Thoughts:  No  Memory:  Immediate;   Good Recent;   Good Remote;   Good  Judgement:  Good  Insight:  Good  Psychomotor Activity:  Normal  Concentration:  Concentration: Good and Attention Span: Good  Recall:  NA  Fund of Knowledge: Good  Language: Good  Akathisia:  No  Handed:  Ambidextrous  AIMS (if indicated):Not done  Assets:  Communication Skills Desire for Improvement Financial Resources/Insurance Housing Social Support  ADL's:  Intact  Cognition: WNL  Sleep:  Good   Screenings: AIMS   Flowsheet Row [facility]  AIMS Total Score 0    AUDIT   Flowsheet Row [facility]  Alcohol Use Disorder Identification Test Final Score (AUDIT) 19 30    GAD-7   [facility]  Total GAD-7 Score 19 10    PHQ2-9   Flowsheet Row [facility]  PHQ-2 Total Score 3 2  PHQ-9 Total Score 11 6    Flowsheet Row [facility]) [facility] from [facility]  C-SSRS RISK CATEGORY Error: Q3, 4, or 5 should not be populated when Q2 is No Error: Q6 is Yes, you must answer 7       Assessment and Plan: Patient notes that his anxiety and depression has worsened due to life stressors.  He however notes that he feels like his psychiatric medications are effective in managing his anxiety and depression.  He  does note that he has had symptoms of ADHD and would like to start medications to help manage this.  Today he is agreeable to starting Intuniv 1 mg nightly to help manage symptoms of ADHD.  He will continue all other medications as prescribed.    1.  Severe recurrent major depression without psychotic features (HCC)  Continue- ARIPiprazole (ABILIFY) 2 MG tablet; Take 1 tablet (2 mg total) by mouth daily.  Dispense: 30 tablet; Refill: 2 Continue- PARoxetine (PAXIL) 40 MG tablet; Take 1 tablet (40 mg total) by mouth daily.  Dispense: 30 tablet; Refill: 2 Continue- traZODone (DESYREL) 100 MG tablet; Take 0.5 tablets (50 mg total) by mouth at bedtime.  Dispense: 30 tablet; Refill: 2   2. Generalized anxiety disorder  Continue- PARoxetine (PAXIL) 40 MG tablet; Take 1 tablet (40 mg total) by mouth daily.  Dispense: 30 tablet; Refill: 2  3. Attention deficit hyperactivity disorder (ADHD), predominantly inattentive type  Start- guanFACINE (INTUNIV) 1 MG TB24 ER tablet; Take 1 tablet (1 mg total) by mouth daily.  Dispense: 30 tablet; Refill: 2   Follow up in 3 months Follow up with therapy  Shanna Cisco, NP 05/19/2020, 10:21 AM

## 2020-08-18 ENCOUNTER — Other Ambulatory Visit: Payer: Self-pay

## 2020-08-18 ENCOUNTER — Telehealth (HOSPITAL_COMMUNITY): Payer: Self-pay | Admitting: Psychiatry

## 2020-08-18 ENCOUNTER — Telehealth (INDEPENDENT_AMBULATORY_CARE_PROVIDER_SITE_OTHER): Payer: Self-pay | Admitting: Psychiatry

## 2020-08-18 DIAGNOSIS — F332 Major depressive disorder, recurrent severe without psychotic features: Secondary | ICD-10-CM

## 2020-08-18 DIAGNOSIS — F900 Attention-deficit hyperactivity disorder, predominantly inattentive type: Secondary | ICD-10-CM

## 2020-08-18 DIAGNOSIS — F411 Generalized anxiety disorder: Secondary | ICD-10-CM

## 2020-08-18 MED ORDER — ARIPIPRAZOLE 2 MG PO TABS: 2.0000 mg | ORAL_TABLET | Freq: Every day | ORAL | 2 refills | Status: DC

## 2020-08-18 MED ORDER — TRAZODONE HCL 100 MG PO TABS: 50.0000 mg | ORAL_TABLET | Freq: Every day | ORAL | 2 refills | Status: DC

## 2020-08-18 MED ORDER — GUANFACINE HCL ER 1 MG PO TB24: 1.0000 mg | ORAL_TABLET | Freq: Every day | ORAL | 2 refills | Status: DC

## 2020-08-18 MED ORDER — PAROXETINE HCL 40 MG PO TABS: 40.0000 mg | ORAL_TABLET | Freq: Every day | ORAL | 2 refills | Status: DC

## 2020-08-18 NOTE — Telephone Encounter (Signed)
Patient informed Clinical research associate that he is at work today and was unable to attend his appointment.  Provider instructed patient to call to reschedule his appointment.  His medications were refilled and sent to preferred pharmacy.

## 2020-10-14 ENCOUNTER — Encounter (HOSPITAL_COMMUNITY): Payer: Self-pay | Admitting: Psychiatry

## 2020-10-14 ENCOUNTER — Other Ambulatory Visit: Payer: Self-pay

## 2020-10-14 ENCOUNTER — Telehealth (INDEPENDENT_AMBULATORY_CARE_PROVIDER_SITE_OTHER): Payer: No Payment, Other | Admitting: Psychiatry

## 2020-10-14 DIAGNOSIS — F411 Generalized anxiety disorder: Secondary | ICD-10-CM | POA: Diagnosis not present

## 2020-10-14 DIAGNOSIS — F332 Major depressive disorder, recurrent severe without psychotic features: Secondary | ICD-10-CM | POA: Diagnosis not present

## 2020-10-14 MED ORDER — ARIPIPRAZOLE 2 MG PO TABS: 2.0000 mg | ORAL_TABLET | Freq: Every day | ORAL | 3 refills | Status: AC

## 2020-10-14 MED ORDER — PAROXETINE HCL 40 MG PO TABS: 40.0000 mg | ORAL_TABLET | Freq: Every day | ORAL | 3 refills | Status: AC

## 2020-10-14 MED ORDER — TRAZODONE HCL 100 MG PO TABS: 50.0000 mg | ORAL_TABLET | Freq: Every day | ORAL | 3 refills | Status: AC

## 2020-10-14 NOTE — Progress Notes (Signed)
BH MD/PA/NP OP Progress Note Virtual Visit via Video Note  I connected with [name] on 10/14/20 at 11:30 AM EDT by a video enabled telemedicine application and verified that I am speaking with the correct person using two identifiers.  Location: Patient: Home Provider: Clinic   I discussed the limitations of evaluation and management by telemedicine and the availability of in person appointments. The patient expressed understanding and agreed to proceed.  I provided 30 minutes of non-face-to-face time during this encounter.     10/14/2020 12:56 PM [name]  MRN:  [id number]  Chief Complaint: "IThings are okay."  HPI:   35 year old male seen today for follow up psychiatric evaluation.  He has a psychiatric history of alcohol use disorder, opioid use disorder, depression, anxiety, bipolar disorder, SI, and cocaine use disorder (patient reports that he never used cocaine and reports that it showed up in his system as a false positive). He is currently managed on Paxil 40 mg, Abilify 2 mg daily, and Trazodone 100 nightly as needed. He notes his medications are somewhat effective in managing his psychiatric conditions.    Today patient was well-groomed, pleasant, cooperative, engaged in conversation.  He informed provider. He informed Clinical research associate that he has been doing well but has recently lost a custody of his daughter to with his ex-girlfriend. He has not seen his child in 8 months. He also reports a pending criminal charge which he suspects will be dropped. These recent events have been causing more stress, but he has been managing it well. He has been sleeping well and endorses a normal appetite. He has been maintaining weight but says he would like to lose some. He says he recently stopped taking Intuniv because he felt like it was ineffective. He continues to struggle with some inattentiveness and difficulty concentrating.   Provider conducted a GAD-7 with a score of 9, at  the last visit he scored a19  . Provider also conducted a PHQ-9 with a score of 6, at the last visit he scored a 11  . Today he endorses no physical pain. He has not had increase in using alcohol or other substances. Today he denies SI, HI, AVH, paranoia, impulsivity, and mania.   Today he informed writer that he would like to discontinue Intuniv as he found it ineffective.  He will continue all other medications as prescribed.  No other concerns noted at this time.       Visit Diagnosis:    ICD-10-CM   1. Severe recurrent major depression without psychotic features (HCC)  F33.2 ARIPiprazole (ABILIFY) 2 MG tablet    PARoxetine (PAXIL) 40 MG tablet    traZODone (DESYREL) 100 MG tablet    2. Generalized anxiety disorder  F41.1 PARoxetine (PAXIL) 40 MG tablet       Past Psychiatric History: alcohol use disorder, opioid use disorder, depression, anxiety, bipolar disorder, SI, and cocaine use disorder (patient reports that he never used cocaine and reports that it showed up in his system as a false positive).  Past Medical History:  Past Medical History:  Diagnosis Date   Anxiety    Asthma    as a child   Bipolar disorder (HCC)    "supposed to take Jordan, but it is too expensive"   Closed head injury with concussion    Complication of anesthesia    "I woke up from surgery in April (23, 2019) very mean"   Depression    History of kidney stones  passed x2 lithotripsy    Past Surgical History:  Procedure Laterality Date   AMPUTATION Right 05/24/2017   Procedure: LITTLE FINGER REPAIR EXTENSOR AND FLEXOR TENDON, LATERAL LIGAMENT REPAIR, NERVE REPAIR, JOINT PINNING;  Surgeon: Dominica Severin, MD;  Location: [facility] OR;  Service: Orthopedics;  Laterality: Right;   CHOLECYSTECTOMY     CYSTOSCOPY W/ STONE MANIPULATION     FOOT SURGERY Right    fracture   HARDWARE REMOVAL Right 07/12/2017   Procedure: Right small finger hardware removal, pin removal;  Surgeon: Dominica Severin, MD;  Location:  [facility] OR;  Service: Orthopedics;  Laterality: Right;  45 mins   LITHOTRIPSY      Family Psychiatric History:  Mother bipolar and depression. Maternal aunts depression, and maternal grandmother depression  Family History:  Family History  Problem Relation Age of Onset   Depression Mother    Alcohol abuse Maternal Uncle    Depression Maternal Uncle     Social History:  Social History   Socioeconomic History   Marital status: Divorced    Spouse name: Not on file   Number of children: Not on file   Years of education: Not on file   Highest education level: Not on file  Occupational History   Not on file  Tobacco Use   Smoking status: Every Day    Packs/day: 1.00    Years: 14.00    Pack years: 14.00    Types: Cigarettes   Smokeless tobacco: Never   Tobacco comments:    Reports has cut back to 1 pk a day, has Chantix but has not started.   Vaping Use   Vaping Use: Never used  Substance and Sexual Activity   Alcohol use: Yes    Alcohol/week: 21.0 standard drinks    Types: 21 Cans of beer per week    Comment: 2-3 per day   Drug use: Not Currently    Frequency: 7.0 times per week    Types: Marijuana   Sexual activity: Yes    Partners: Female    Birth control/protection: None  Other Topics Concern   Not on file  Social History Narrative   Not on file   Social Determinants of Health   Financial Resource Strain: Not on file  Food Insecurity: Not on file  Transportation Needs: Not on file  Physical Activity: Not on file  Stress: Not on file  Social Connections: Not on file    Allergies: No Known Allergies  Metabolic Disorder Labs: No results found for: HGBA1C, MPG No results found for: PROLACTIN No results found for: CHOL, TRIG, HDL, CHOLHDL, VLDL, LDLCALC No results found for: TSH  Therapeutic Level Labs: No results found for: LITHIUM No results found for: VALPROATE No components found for:  CBMZ  Current Medications: Current Outpatient Medications   Medication Sig Dispense Refill   ARIPiprazole (ABILIFY) 2 MG tablet Take 1 tablet (2 mg total) by mouth daily. 30 tablet 3   oxyCODONE-acetaminophen (PERCOCET/ROXICET) 5-325 MG tablet Take 1 tablet by mouth every 6 (six) hours as needed for severe pain. 10 tablet 0   PARoxetine (PAXIL) 40 MG tablet Take 1 tablet (40 mg total) by mouth daily. 30 tablet 3   potassium chloride SA (KLOR-CON M15) 15 MEQ tablet Take 1 tablet (15 mEq total) by mouth 2 (two) times daily. 14 tablet 0   traZODone (DESYREL) 100 MG tablet Take 0.5 tablets (50 mg total) by mouth at bedtime. 30 tablet 3   No current facility-administered medications for this visit.  Musculoskeletal: Strength & Muscle Tone:  Unable to assess due to telehealth visit Gait & Station:  Unable to assess due to telehealth visit Patient leans: N/A  Psychiatric Specialty Exam: Review of Systems  There were no vitals taken for this visit.There is no height or weight on file to calculate BMI.  General Appearance: Well Groomed  Eye Contact:  Good  Speech:  Clear and Coherent and Normal Rate  Volume:  Normal  Mood:  Euthymic  Affect:  Appropriate and Congruent  Thought Process:  Coherent, Goal Directed and Linear  Orientation:  Full (Time, Place, and Person)  Thought Content: WDL and Logical   Suicidal Thoughts:  No  Homicidal Thoughts:  No  Memory:  Immediate;   Good Recent;   Good Remote;   Good  Judgement:  Good  Insight:  Good  Psychomotor Activity:  Normal  Concentration:  Concentration: Good and Attention Span: Good  Recall:  NA  Fund of Knowledge: Good  Language: Good  Akathisia:  No  Handed:  Ambidextrous  AIMS (if indicated):Not done  Assets:  Communication Skills Desire for Improvement Financial Resources/Insurance Housing Social Support  ADL's:  Intact  Cognition: WNL  Sleep:  Good   Screenings: AIMS    Flowsheet Row Admission (Discharged) from 03/25/2015 in [facility] INPATIENT BEHAVIORAL MEDICINE  AIMS Total  Score 0      AUDIT    Flowsheet Row Admission (Discharged) from 08/15/2018 in [facility] INPATIENT BEHAV[facility]AL MEDICINE Admission (Discharged) from 03/25/2015 in [facility] INPATIENT BEHAVIORAL MEDICINE  Alcohol Use Disorder Identification Test Final Score (AUDIT) 19 30      GAD-7    Flowsheet Row Video Visit from 10/14/2020 in [facility] Video Visit from 05/19/2020 in [facility] Video Visit from 02/18/2020 in [facility]  Total GAD-7 Score 9 19 10       PHQ2-9    Flowsheet Row Video Visit from 10/14/2020 in [facility] Video Visit from 05/19/2020 in [facility] Video Visit from 02/18/2020 in [facility]  PHQ-2 Total Score 2 3 2   PHQ-9 Total Score 6 11 6       Flowsheet Row Admission (Discharged) from 08/15/2018 in [facility] INPATIENT BEHAVIORAL MEDICINE ED from 08/13/2018 in [facility]  C-SSRS RISK CATEGORY Error: Q3, 4, or 5 should not be populated when Q2 is No Error: Q6 is Yes, you must answer 7        Assessment and Plan: Patient notes that his anxiety and depression has improved since his last visit.  Today he informed writer that he would like to discontinue Intuniv as he found it ineffective.  He will continue all other medications as prescribed.  1. Severe recurrent major depression without psychotic features (HCC)  Continue- ARIPiprazole (ABILIFY) 2 MG tablet; Take 1 tablet (2 mg total) by mouth daily.  Dispense: 30 tablet; Refill: 3 Continue- PARoxetine (PAXIL) 40 MG tablet; Take 1 tablet (40 mg total) by mouth daily.  Dispense: 30 tablet; Refill: 3 Continue- traZODone (DESYREL) 100 MG tablet; Take 0.5 tablets (50 mg total) by mouth at bedtime.  Dispense: 30 tablet; Refill: 3  2. Generalized anxiety disorder  Continue- PARoxetine (PAXIL) 40 MG tablet; Take 1 tablet (40 mg  total) by mouth daily.  Dispense: 30 tablet; Refill: 3  Follow up in 3 months Follow up with therapy  [facility], NP 10/14/2020, 12:56 PM

## 2021-01-11 ENCOUNTER — Emergency Department (HOSPITAL_COMMUNITY): Payer: 59

## 2021-01-11 ENCOUNTER — Encounter (HOSPITAL_COMMUNITY): Payer: Self-pay | Admitting: Emergency Medicine

## 2021-01-11 ENCOUNTER — Emergency Department (HOSPITAL_COMMUNITY)
Admission: EM | Admit: 2021-01-11 | Discharge: 2021-01-11 | Disposition: A | Discharge status: Home / Self Care | Payer: 59 | Attending: Emergency Medicine | Admitting: Emergency Medicine

## 2021-01-11 DIAGNOSIS — J0100 Acute maxillary sinusitis, unspecified: Secondary | ICD-10-CM | POA: Diagnosis not present

## 2021-01-11 DIAGNOSIS — Z20822 Contact with and (suspected) exposure to covid-19: Secondary | ICD-10-CM | POA: Diagnosis not present

## 2021-01-11 DIAGNOSIS — R059 Cough, unspecified: Secondary | ICD-10-CM | POA: Diagnosis present

## 2021-01-11 DIAGNOSIS — J45909 Unspecified asthma, uncomplicated: Secondary | ICD-10-CM | POA: Diagnosis not present

## 2021-01-11 DIAGNOSIS — F17210 Nicotine dependence, cigarettes, uncomplicated: Secondary | ICD-10-CM | POA: Insufficient documentation

## 2021-01-11 LAB — RESP PANEL BY RT-PCR (FLU A&B, COVID) ARPGX2
Influenza A by PCR: NEGATIVE
Influenza B by PCR: NEGATIVE
SARS Coronavirus 2 by RT PCR: NEGATIVE

## 2021-01-11 MED ORDER — DEXAMETHASONE SODIUM PHOSPHATE 10 MG/ML IJ SOLN
10.0000 mg | Freq: Once | INTRAMUSCULAR | Status: AC
  Administered 2021-01-11: 10 mg via INTRAMUSCULAR
  Filled 2021-01-11: qty 1

## 2021-01-11 NOTE — ED Triage Notes (Signed)
Patient c/o productive cough and congestion x2 days. Denies fevers.

## 2021-01-11 NOTE — ED Provider Notes (Signed)
[facility]-EMERGENCY DEPT Provider Note   CSN: [id number] Arrival date & time: 01/11/21  1104     History Chief Complaint  Patient presents with   Cough    [name] is a 35 y.o. male with past medical history significant for asthma in childhood who presents with productive cough, congestion, left-sided face swelling for the last 3 days.  Patient denies any sore throat, fever, chills, nausea, vomiting, abdominal pain.  Patient reports that he has a lot of sinus pain, pressure, as well as yellow-greenish drainage, and productive cough.  He describes his cough is wet, reports that nothing makes it any better.  Worse in the morning.  Patient has tried over-the-counter cough and cold medicine with minimal relief.   Cough     Past Medical History:  Diagnosis Date   Anxiety    Asthma    as a child   Bipolar disorder (HCC)    "supposed to take Jordan, but it is too expensive"   Closed head injury with concussion    Complication of anesthesia    "I woke up from surgery in April (23, 2019) very mean"   Depression    History of kidney stones     passed x2 lithotripsy    Patient Active Problem List   Diagnosis Date Noted   Generalized anxiety disorder 05/19/2020   Attention deficit hyperactivity disorder (ADHD), predominantly inattentive type 05/19/2020   Severe recurrent major depression without psychotic features (HCC) 08/15/2018   Cocaine abuse (HCC) 08/15/2018   Cannabis abuse 08/15/2018   Opioid use disorder, moderate, in sustained remission (HCC) 05/17/2018   Moderate episode of recurrent major depressive disorder (HCC) 04/19/2018   Forklift accident, initial encounter 04/12/2018   Closed displaced fracture of second metatarsal bone of left foot 04/12/2018   Closed displaced fracture of third metatarsal bone of left foot 04/12/2018   Closed displaced fracture of fourth metatarsal bone of left foot 04/12/2018   Status post surgery 05/24/2017    Laceration of finger, right 05/24/2017   Opioid use disorder, severe, dependence (HCC) 03/26/2015   Alcohol use disorder, severe, dependence (HCC) 03/26/2015   Tobacco use disorder 03/26/2015   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) 03/26/2015    Past Surgical History:  Procedure Laterality Date   AMPUTATION Right 05/24/2017   Procedure: LITTLE FINGER REPAIR EXTENSOR AND FLEXOR TENDON, LATERAL LIGAMENT REPAIR, NERVE REPAIR, JOINT PINNING;  Surgeon: Dominica Severin, MD;  Location: [facility] OR;  Service: Orthopedics;  Laterality: Right;   CHOLECYSTECTOMY     CYSTOSCOPY W/ STONE MANIPULATION     FOOT SURGERY Right    fracture   HARDWARE REMOVAL Right 07/12/2017   Procedure: Right small finger hardware removal, pin removal;  Surgeon: Dominica Severin, MD;  Location: [facility] OR;  Service: Orthopedics;  Laterality: Right;  45 mins   LITHOTRIPSY         Family History  Problem Relation Age of Onset   Depression Mother    Alcohol abuse Maternal Uncle    Depression Maternal Uncle     Social History   Tobacco Use   Smoking status: Every Day    Packs/day: 1.00    Years: 14.00    Pack years: 14.00    Types: Cigarettes   Smokeless tobacco: Never   Tobacco comments:    Reports has cut back to 1 pk a day, has Chantix but has not started.   Vaping Use   Vaping  Use: Never used  Substance Use Topics   Alcohol use: Yes    Alcohol/week: 21.0 standard drinks    Types: 21 Cans of beer per week    Comment: 2-3 per day   Drug use: Not Currently    Frequency: 7.0 times per week    Types: Marijuana    Home Medications Prior to Admission medications   Medication Sig Start Date End Date Taking? Authorizing Provider  ARIPiprazole (ABILIFY) 2 MG tablet Take 1 tablet (2 mg total) by mouth daily. 10/14/20   Shanna Cisco, NP  oxyCODONE-acetaminophen (PERCOCET/ROXICET) 5-325 MG tablet Take 1 tablet by mouth every 6 (six) hours as  needed for severe pain. 07/31/19   Horton, Mayer Masker, MD  PARoxetine (PAXIL) 40 MG tablet Take 1 tablet (40 mg total) by mouth daily. 10/14/20   Shanna Cisco, NP  potassium chloride SA (KLOR-CON M15) 15 MEQ tablet Take 1 tablet (15 mEq total) by mouth 2 (two) times daily. 01/11/20   Minna Antis, MD  traZODone (DESYREL) 100 MG tablet Take 0.5 tablets (50 mg total) by mouth at bedtime. 10/14/20   Shanna Cisco, NP    Allergies    Patient has no known allergies.  Review of Systems   Review of Systems  HENT:  Positive for facial swelling.   Respiratory:  Positive for cough.   All other systems reviewed and are negative.  Physical Exam Updated Vital Signs BP 120/85   Pulse (!) 104   Temp 98.3 F (36.8 C) (Oral)   Resp 18   SpO2 98%   Physical Exam Vitals and nursing note reviewed.  Constitutional:      General: He is not in acute distress.    Appearance: Normal appearance.  HENT:     Head: Normocephalic and atraumatic.     Comments: Tenderness to palpation over the left maxillary sinus.    Nose: Congestion present.  Eyes:     General:        Right eye: No discharge.        Left eye: No discharge.  Cardiovascular:     Rate and Rhythm: Normal rate and regular rhythm.  Pulmonary:     Effort: Pulmonary effort is normal. No respiratory distress.  Musculoskeletal:        General: No deformity.  Skin:    General: Skin is warm and dry.     Comments: Patient has lateral swelling in periorbital distribution on the left side without any evidence of fluctuance, cellulitis.  There is no evidence of purulent drainage.  There is no erythema of the cornea or sclera on the left.  No pain with extraocular movements.  Neurological:     Mental Status: He is alert and oriented to person, place, and time.  Psychiatric:        Mood and Affect: Mood normal.        Behavior: Behavior normal.    ED Results / Procedures / Treatments   Labs (all labs ordered are listed, but  only abnormal results are displayed) Labs Reviewed  RESP PANEL BY RT-PCR (FLU A&B, COVID) [id number]    EKG None  Radiology DG Chest 2 View  Result Date: 01/11/2021 CLINICAL DATA:  Cough EXAM: CHEST - 2 VIEW COMPARISON:  CT examination dated January 10, 2020 FINDINGS: The heart size and mediastinal contours are within normal limits. Both lungs are clear. The visualized skeletal structures are unremarkable. IMPRESSION: No active cardiopulmonary disease. Electronically Signed   By: Larose Hires  D.O.   On: 01/11/2021 12:58    Procedures Procedures   Medications Ordered in ED Medications  dexamethasone (DECADRON) injection 10 mg (10 mg Intramuscular Given 01/11/21 1349)    ED Course  I have reviewed the triage vital signs and the nursing notes.  Pertinent labs & imaging results that were available during my care of the patient were reviewed by me and considered in my medical decision making (see chart for details).   MDM Rules/Calculators/A&P                          Overall well-appearing patient with 3 to 4 days of upper respiratory versus sinusitis symptoms.  Patient has some unilateral face swelling, tenderness to palpation over the left maxillary sinus.  Patient description of cough productive of yellow-green sputum, and some rhonchi heard on auscultation we will obtain chest x-ray to evaluate for possible pneumonia.  Otherwise patient with signs and symptoms of likely viral sinusitis.  Chest x-ray without abnormality, RVP negative for COVID, flu.  Patient with intermittent tachycardia, with no evidence of unilateral leg swelling, recent travel, or other risk factors for pulmonary embolism.  We will administer Decadron 10 mg IM to help with congestion, sinusitis.  Encouraged over-the-counter decongestant, Tylenol, ibuprofen for body aches, fever.  Patient discharged in stable condition at this time, return precautions given. Final Clinical Impression(s) / ED Diagnoses Final diagnoses:   Acute maxillary sinusitis, recurrence not specified    Rx / DC Orders ED Discharge Orders     None        Olene Floss, PA-C 01/11/21 1452    Pricilla Loveless, MD 01/11/21 1531

## 2021-01-11 NOTE — Discharge Instructions (Addendum)
If you develop a fever, worsening symptoms please return for further evaluation.

## 2021-01-15 ENCOUNTER — Telehealth (HOSPITAL_COMMUNITY): Payer: 59 | Admitting: Psychiatry

## 2022-06-12 IMAGING — CR DG CHEST 2V
2 series · 2 of 2 positions shown · non-contrast
Comparison: CT examination dated January 10, 2020

CLINICAL DATA: Cough

EXAM:
CHEST - 2 VIEW

[w chest pa]
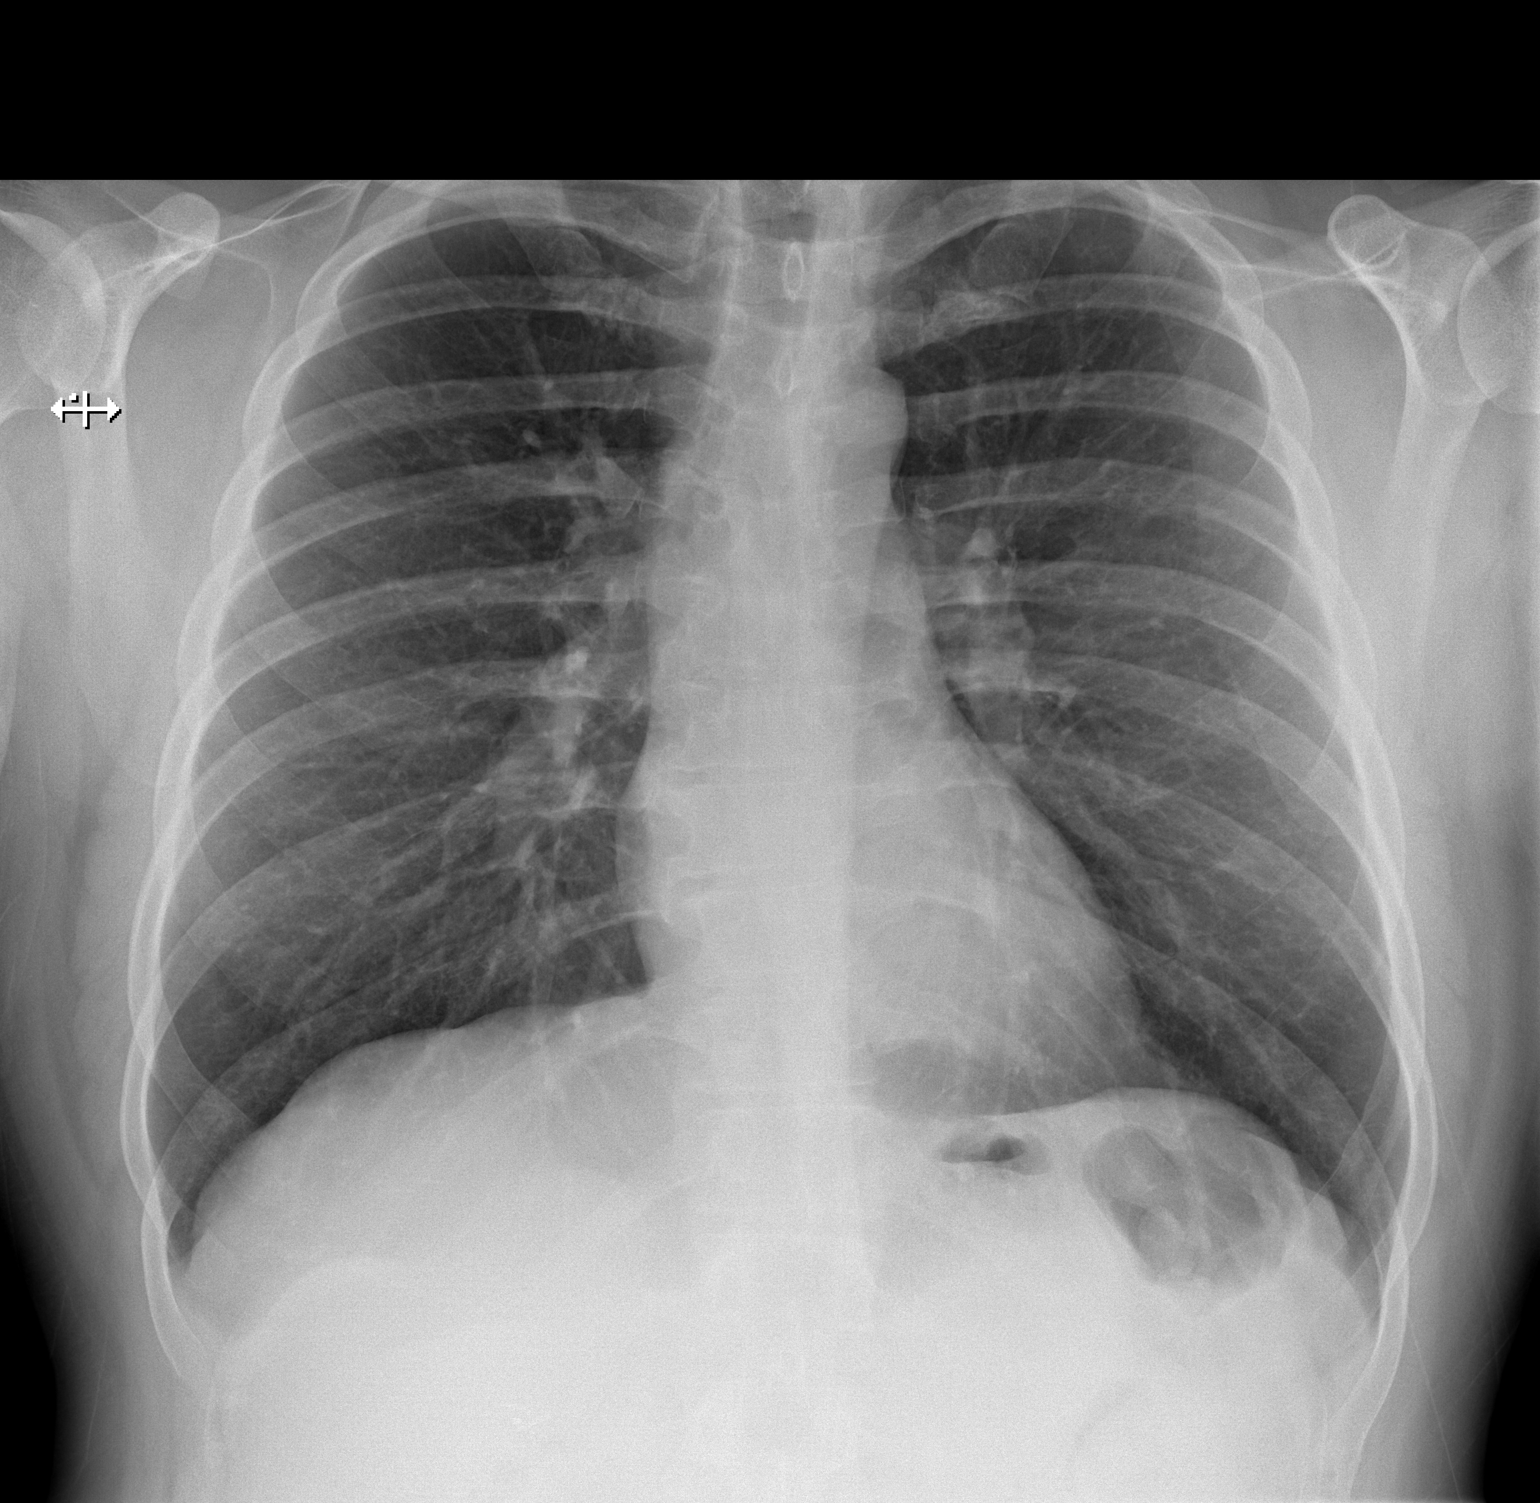

[w chest lat]
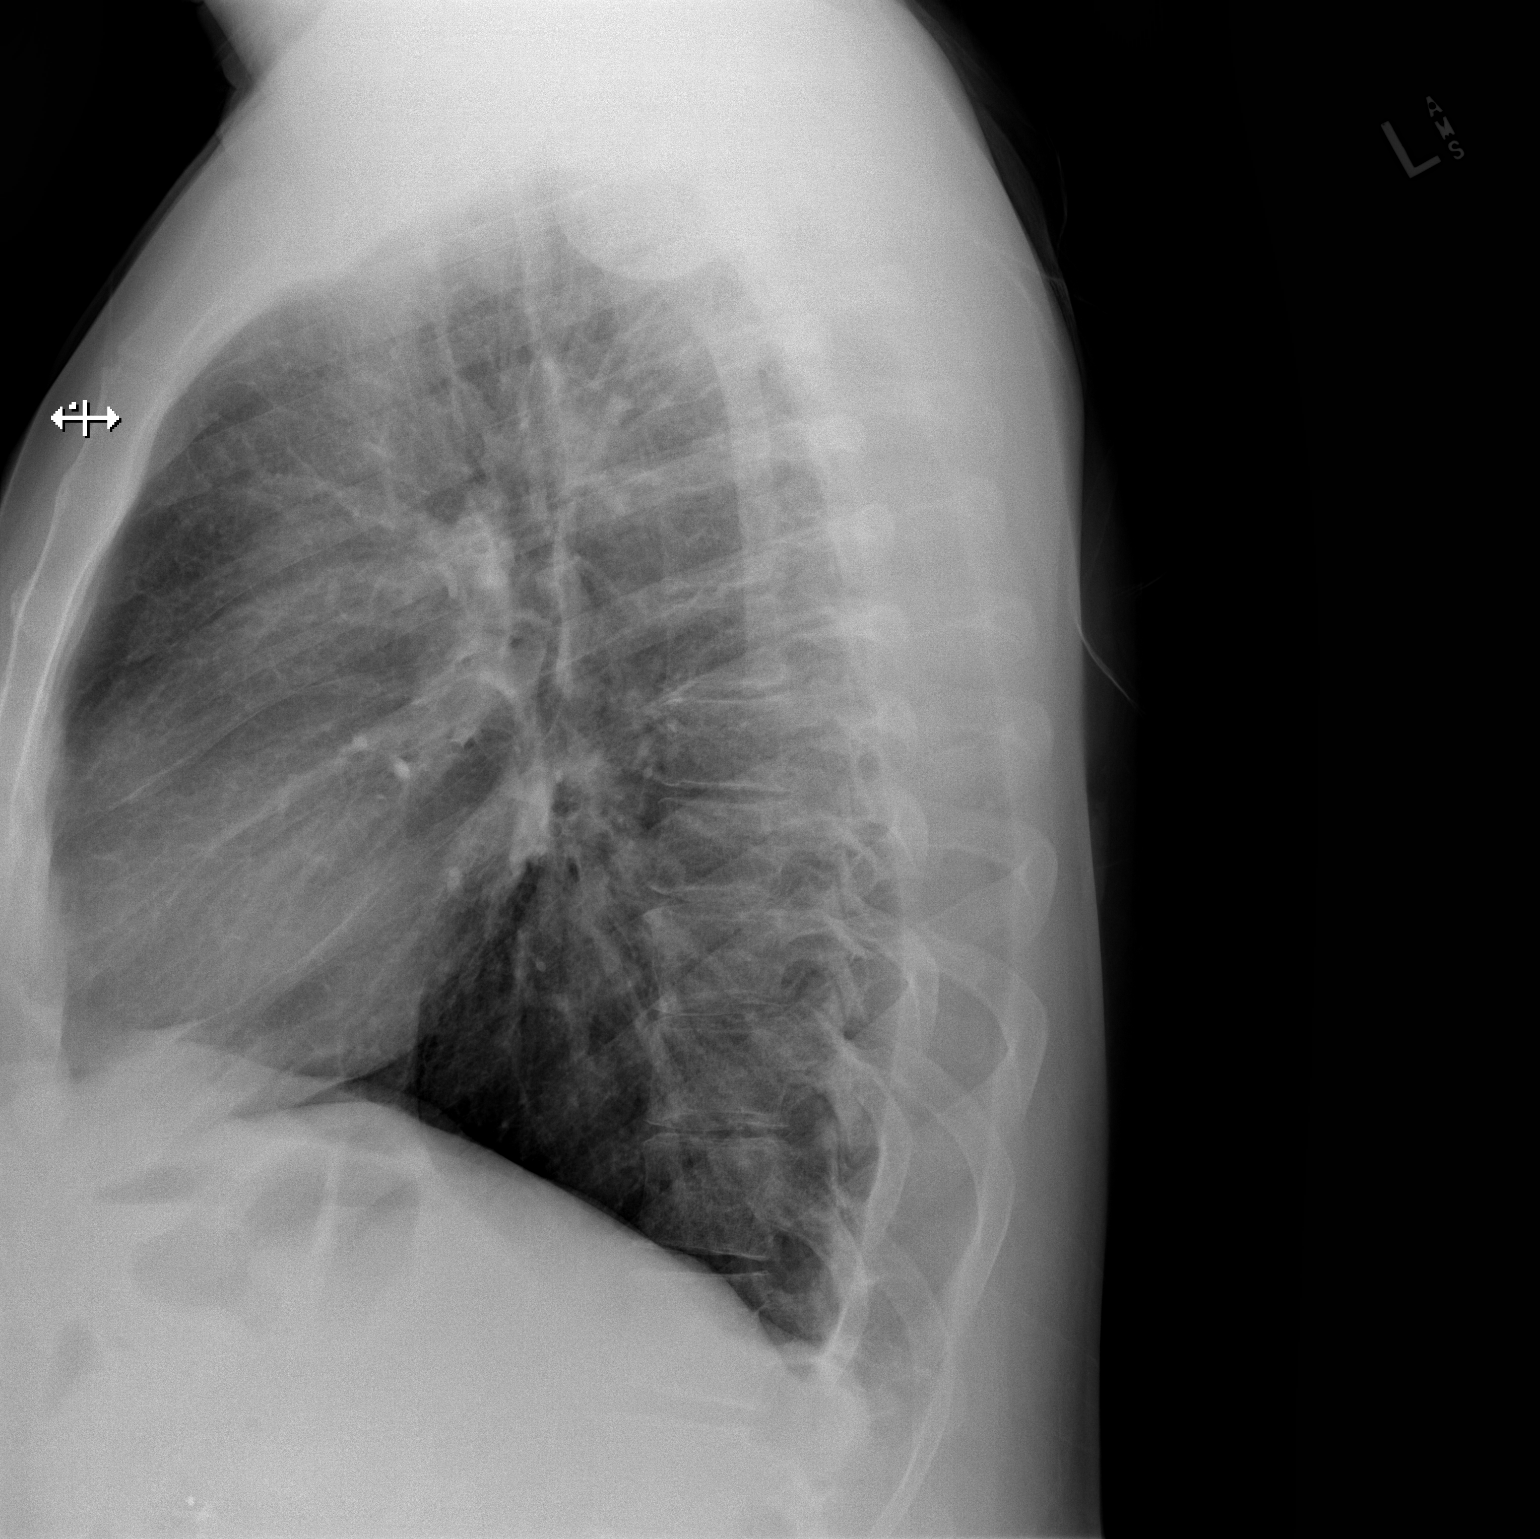

[2 of 2 positions shown; findings below may reference images not displayed]

FINDINGS: The heart size and mediastinal contours are within normal limits.
Both lungs are clear. The visualized skeletal structures are
unremarkable.
IMPRESSION: No active cardiopulmonary disease.
